# Patient Record
Sex: Male | Born: 1955
Health system: Southern US, Community
[De-identification: ages and names within clinical notes are randomized; demographics above are authoritative.]

## PROBLEM LIST (undated history)

## (undated) DIAGNOSIS — Z8249 Family history of ischemic heart disease and other diseases of the circulatory system: Secondary | ICD-10-CM

## (undated) DIAGNOSIS — M2550 Pain in unspecified joint: Secondary | ICD-10-CM

## (undated) DIAGNOSIS — E7800 Pure hypercholesterolemia, unspecified: Secondary | ICD-10-CM

## (undated) DIAGNOSIS — K219 Gastro-esophageal reflux disease without esophagitis: Secondary | ICD-10-CM

## (undated) DIAGNOSIS — IMO0001 Reserved for inherently not codable concepts without codable children: Secondary | ICD-10-CM

## (undated) DIAGNOSIS — M1990 Unspecified osteoarthritis, unspecified site: Secondary | ICD-10-CM

## (undated) DIAGNOSIS — S2239XA Fracture of one rib, unspecified side, initial encounter for closed fracture: Secondary | ICD-10-CM

## (undated) DIAGNOSIS — C61 Malignant neoplasm of prostate: Secondary | ICD-10-CM

## (undated) DIAGNOSIS — I10 Essential (primary) hypertension: Secondary | ICD-10-CM

## (undated) DIAGNOSIS — R262 Difficulty in walking, not elsewhere classified: Secondary | ICD-10-CM

## (undated) HISTORY — DX: Pain in unspecified joint: M25.50

## (undated) HISTORY — DX: Essential (primary) hypertension: I10

## (undated) HISTORY — DX: Family history of ischemic heart disease and other diseases of the circulatory system: Z82.49

## (undated) HISTORY — DX: Reserved for inherently not codable concepts without codable children: IMO0001

## (undated) HISTORY — DX: Difficulty in walking, not elsewhere classified: R26.2

## (undated) HISTORY — DX: Pure hypercholesterolemia, unspecified: E78.00

## (undated) HISTORY — PX: VASECTOMY: SHX75

---

## 1981-01-05 HISTORY — PX: OTHER SURGICAL HISTORY: SHX169

## 1982-01-05 DIAGNOSIS — S2249XA Multiple fractures of ribs, unspecified side, initial encounter for closed fracture: Secondary | ICD-10-CM

## 1982-01-05 HISTORY — DX: Multiple fractures of ribs, unspecified side, initial encounter for closed fracture: S22.49XA

## 2004-01-06 DIAGNOSIS — IMO0001 Reserved for inherently not codable concepts without codable children: Secondary | ICD-10-CM

## 2004-01-06 HISTORY — DX: Reserved for inherently not codable concepts without codable children: IMO0001

## 2010-04-10 ENCOUNTER — Encounter: Payer: Self-pay | Admitting: Cardiology

## 2010-04-10 DIAGNOSIS — M2550 Pain in unspecified joint: Secondary | ICD-10-CM | POA: Insufficient documentation

## 2010-04-10 DIAGNOSIS — R262 Difficulty in walking, not elsewhere classified: Secondary | ICD-10-CM | POA: Insufficient documentation

## 2010-04-10 DIAGNOSIS — H9190 Unspecified hearing loss, unspecified ear: Secondary | ICD-10-CM | POA: Insufficient documentation

## 2010-04-10 DIAGNOSIS — I10 Essential (primary) hypertension: Secondary | ICD-10-CM | POA: Insufficient documentation

## 2010-04-10 DIAGNOSIS — H9319 Tinnitus, unspecified ear: Secondary | ICD-10-CM | POA: Insufficient documentation

## 2010-04-10 DIAGNOSIS — E7800 Pure hypercholesterolemia, unspecified: Secondary | ICD-10-CM | POA: Insufficient documentation

## 2010-04-10 DIAGNOSIS — E785 Hyperlipidemia, unspecified: Secondary | ICD-10-CM | POA: Insufficient documentation

## 2010-04-14 ENCOUNTER — Ambulatory Visit (INDEPENDENT_AMBULATORY_CARE_PROVIDER_SITE_OTHER): Payer: 59 | Admitting: Cardiology

## 2010-04-14 ENCOUNTER — Encounter: Payer: Self-pay | Admitting: Cardiology

## 2010-04-14 DIAGNOSIS — E785 Hyperlipidemia, unspecified: Secondary | ICD-10-CM

## 2010-04-14 DIAGNOSIS — Z8249 Family history of ischemic heart disease and other diseases of the circulatory system: Secondary | ICD-10-CM

## 2010-04-14 DIAGNOSIS — I10 Essential (primary) hypertension: Secondary | ICD-10-CM

## 2010-04-14 NOTE — Assessment & Plan Note (Signed)
I'll have him check his blood pressure readings at home. They're slightly elevated today. He will remain on his lisinopril at the current does however. If his blood pressure is elevated, we will need to increase his lisinopril. 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

## 2010-04-14 NOTE — Assessment & Plan Note (Signed)
I will have him see Dr. Elease Hashimoto in one year with [facility].

## 2010-04-14 NOTE — Progress Notes (Signed)
Subjective:   Richard Wong comes in for followup visit. He is a IT sales professional. His wife is Richard Wong. He's not had any significant chest pain, lightheadedness, or dizziness. Activity is somewhat limited due to arthritis in his right knee. He does have a strong family history of heart disease. He remains. 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  Current Outpatient Prescriptions  Medication Sig Dispense Refill  . aspirin 81 MG tablet Take 81 mg by mouth daily.        Marland Kitchen atorvastatin (LIPITOR) 20 MG tablet Take 20 mg by mouth daily.        Marland Kitchen lisinopril (PRINIVIL,ZESTRIL) 10 MG tablet Take 10 mg by mouth daily.        Marland Kitchen omeprazole (PRILOSEC OTC) 20 MG tablet Take 1 tablet (20 mg total) by mouth daily. TAKE 1/2 TABLET DAIILY  30 tablet  0    No Known Allergies  Patient Active Problem List  Diagnoses  . Hypertension  . Hyperlipidemia  . Tinnitus  . Hearing loss  . Joint pain  . Difficulty walking  . Hypercholesterolemia    History  Smoking status  . Never Smoker   Smokeless tobacco  . Not on file    History  Alcohol Use No    Family History  Problem Relation Age of Onset  . Heart attack Father   . Heart disease      Review of Systems:   The patient denies any heat or cold intolerance.  No weight gain or weight loss.  The patient denies headaches or blurry vision.  There is no cough or sputum production.  The patient denies dizziness.  There is no hematuria or hematochezia.  The patient denies any muscle aches or arthritis.  The patient denies any rash.  The patient denies frequent falling or instability.  There is no history of depression or anxiety.  All other systems were reviewed and are negative.   Physical Exam:   Weight is 187. Blood pressure 142/88 sitting, heart rate is 71.The head is normocephalic and atraumatic.  Pupils are equally round and reactive to light.  Sclerae nonicteric.  Conjunctiva is clear.  Oropharynx is unremarkable.   There's adequate oral airway.  Neck is supple there are no masses.  Thyroid is not enlarged.  There is no lymphadenopathy.  Lungs are clear.  Chest is symmetric.  Heart shows a regular rate and rhythm.  S1 and S2 are normal.  There is no murmur click or gallop.  Abdomen is soft normal bowel sounds.  There is no organomegaly.  Genital and rectal deferred.  Extremities are without edema.  Peripheral pulses are adequate.  Neurologically intact.  Full range of motion.  The patient is not depressed.  Skin is warm and dry.   EKG is normal  Assessment / Plan:

## 2010-04-14 NOTE — Assessment & Plan Note (Signed)
I've asked him to get recent lab work from his previous physical exam. New prescriptions are written.

## 2010-05-20 ENCOUNTER — Other Ambulatory Visit: Payer: Self-pay | Admitting: Nurse Practitioner

## 2010-05-20 NOTE — Telephone Encounter (Signed)
escribe medication per fax request  

## 2011-04-22 ENCOUNTER — Other Ambulatory Visit: Payer: Self-pay | Admitting: Cardiology

## 2011-07-21 ENCOUNTER — Other Ambulatory Visit: Payer: Self-pay | Admitting: Nurse Practitioner

## 2011-08-19 ENCOUNTER — Encounter: Payer: Self-pay | Admitting: Nurse Practitioner

## 2011-12-28 ENCOUNTER — Other Ambulatory Visit: Payer: Self-pay | Admitting: Nurse Practitioner

## 2011-12-28 ENCOUNTER — Telehealth: Payer: Self-pay | Admitting: Nurse Practitioner

## 2011-12-28 MED ORDER — ATORVASTATIN CALCIUM 20 MG PO TABS: 20.0000 mg | ORAL_TABLET | Freq: Every day | ORAL | Status: DC

## 2011-12-28 NOTE — Telephone Encounter (Signed)
New problem:   lipitor 20 mg,. [facility] at [address].

## 2012-01-08 ENCOUNTER — Ambulatory Visit: Payer: 59 | Admitting: Nurse Practitioner

## 2012-01-26 ENCOUNTER — Encounter: Payer: Self-pay | Admitting: Nurse Practitioner

## 2012-01-26 ENCOUNTER — Ambulatory Visit (INDEPENDENT_AMBULATORY_CARE_PROVIDER_SITE_OTHER): Payer: 59 | Admitting: Nurse Practitioner

## 2012-01-26 VITALS — BP 136/78 | HR 87 | Ht 69.0 in | Wt 188.8 lb

## 2012-01-26 DIAGNOSIS — E785 Hyperlipidemia, unspecified: Secondary | ICD-10-CM

## 2012-01-26 DIAGNOSIS — I10 Essential (primary) hypertension: Secondary | ICD-10-CM

## 2012-01-26 MED ORDER — ATORVASTATIN CALCIUM 20 MG PO TABS: 20.0000 mg | ORAL_TABLET | ORAL | Status: DC

## 2012-01-26 MED ORDER — LISINOPRIL 10 MG PO TABS: 10.0000 mg | ORAL_TABLET | Freq: Every day | ORAL | Status: DC

## 2012-01-26 NOTE — Patient Instructions (Addendum)
Stay on your current medicines  Continue to send [place] your lab work  Stay active  See Dr. Elease Hashimoto in one year  Call the [facility] office at [phone] if you have any questions, problems or concerns.

## 2012-01-26 NOTE — Progress Notes (Signed)
Richard Wong Date of Birth: [birth year] Medical Record #[id number]  History of Present Illness: Richard Wong is seen back today for a follow up visit. It is an approximate 2 year check. He is a former patient of Dr. Ronnald Nian and will be establishing with Dr. Elease Hashimoto. He has HTN, HLD and a strong family history of CAD. 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 Last nuclear scan by [place] was back in 2006.   He comes in today. He is here alone. He is doing ok. No chest pain or shortness of breath. 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 He continues to work at the [employer]. His stress test now consists of running for 10 minutes with an EKG. He has not had any problems. Mostly limited by knee pain and generalized arthritis. Feels ok on his medicines. Needs refills.   Current Outpatient Prescriptions on File Prior to Visit  Medication Sig Dispense Refill  . aspirin 81 MG tablet Take 81 mg by mouth daily.        Marland Kitchen atorvastatin (LIPITOR) 20 MG tablet Take 1 tablet (20 mg total) by mouth as directed. 1/2 daily  90 tablet  3  . lisinopril (PRINIVIL,ZESTRIL) 10 MG tablet Take 1 tablet (10 mg total) by mouth daily.  90 tablet  3  . omeprazole (PRILOSEC OTC) 20 MG tablet Take 1 tablet (20 mg total) by mouth daily. TAKE 1/2 TABLET DAIILY  30 tablet  0    No Known Allergies  Past Medical History  Diagnosis Date  . Hypertension   . Hyperlipidemia   . Tinnitus   . Hearing loss   . Joint pain   . Difficulty walking   . Hypercholesterolemia   . Family history of cardiovascular disease   . Normal cardiac stress test 2006    Past Surgical History  Procedure Date  . Liver laceration 1983  . Vasectomy     History  Smoking status  . Never Smoker   Smokeless tobacco  . Not on file    History  Alcohol Use No    Family History  Problem Relation Age of Onset  . Heart attack Father   . Heart disease      Review of  Systems: The review of systems is per the HPI.  All other systems were reviewed and are negative.  Physical Exam: BP 136/78  Pulse 87  Ht 5\' 9"  (1.753 m)  Wt 188 lb 12.8 oz (85.639 kg)  BMI 27.88 kg/m2 Patient is very pleasant and in no acute distress. Skin is warm and dry. Color is normal.  HEENT is unremarkable. Normocephalic/atraumatic. PERRL. Sclera are nonicteric. Neck is supple. No masses. No JVD. Lungs are clear. Cardiac exam shows a regular rate and rhythm. Abdomen is soft. Extremities are without edema. Gait and ROM are intact. No gross neurologic deficits noted.  LABORATORY DATA: EKG today shows sinus rhythm and is normal.   His labs are noted as well today. Chemistries and CBC are normal. TC is 157, TG 98, HDL 45 and LDL 95.   No results found for this basename: WBC,  HGB,  HCT,  PLT,  GLUCOSE,  CHOL,  TRIG,  HDL,  LDLDIRECT,  LDLCALC,  ALT,  AST,  NA,  K,  CL,  CREATININE,  BUN,  CO2,  TSH,  PSA,  INR,  GLUF,  HGBA1C,  MICROALBUR   Assessment / Plan: 1. HTN -  blood pressure looks ok. No change in his regimen.   2. HLD - LDL is 95. No change in therapy. Exercise is encouraged.   3. Positive FH for CV disease - no symptoms at this time. Energy level is good. No chest pain.   Will see him back in one year. Medicines are refilled today. He will see Dr. Elease Hashimoto in 1 year.   Patient is agreeable to this plan and will call if any problems develop in the interim.

## 2012-02-16 ENCOUNTER — Telehealth: Payer: Self-pay | Admitting: Nurse Practitioner

## 2012-02-16 NOTE — Telephone Encounter (Signed)
New Problem:    Patient's wife called in because her husband's hand is hurting and she believes that it is connected to him taking atorvastatin (LIPITOR) 20 MG tablet.  Please call back.

## 2012-02-17 ENCOUNTER — Telehealth: Payer: Self-pay | Admitting: *Deleted

## 2012-02-17 NOTE — Telephone Encounter (Signed)
Typically, if there is a reaction from the statin, it would be diffuse muscle aching - like he has the flu - not localized to one area.

## 2012-02-17 NOTE — Telephone Encounter (Signed)
S/w pt I stated when you are on a statin achiness is not localized it is all over the body pt stated the pain was just in his one arm and he would continue to take the statin told pt to call if any more problems with arm.

## 2012-11-12 ENCOUNTER — Other Ambulatory Visit: Payer: Self-pay | Admitting: Nurse Practitioner

## 2013-01-16 ENCOUNTER — Other Ambulatory Visit: Payer: Self-pay | Admitting: Nurse Practitioner

## 2013-01-31 ENCOUNTER — Encounter: Payer: Self-pay | Admitting: Nurse Practitioner

## 2013-01-31 ENCOUNTER — Ambulatory Visit (INDEPENDENT_AMBULATORY_CARE_PROVIDER_SITE_OTHER): Payer: 59 | Admitting: Nurse Practitioner

## 2013-01-31 VITALS — BP 130/88 | HR 88 | Ht 69.0 in | Wt 193.4 lb

## 2013-01-31 DIAGNOSIS — E785 Hyperlipidemia, unspecified: Secondary | ICD-10-CM

## 2013-01-31 DIAGNOSIS — I10 Essential (primary) hypertension: Secondary | ICD-10-CM

## 2013-01-31 MED ORDER — ATORVASTATIN CALCIUM 20 MG PO TABS: 20.0000 mg | ORAL_TABLET | Freq: Every day | ORAL | Status: DC

## 2013-01-31 MED ORDER — LISINOPRIL 10 MG PO TABS: 10.0000 mg | ORAL_TABLET | Freq: Every day | ORAL | Status: DC

## 2013-01-31 NOTE — Progress Notes (Signed)
Richard Wong Date of Birth: 1955-06-12 Medical Record #[id number]  History of Present Illness: Richard Wong is seen back today for a one year check. Seen for Dr. Acie Fredrickson - he is a former patient of Dr. Susa Simmonds. He has HTN, HLD and strong family history of CAD. 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 Remote Myoview in 2006.  Last seen a year ago - was doing ok.   Comes back today. Here alone. Doing well. Needs his Lipitor refilled. Brings in his labs for review today - these look good. Tolerating his medicines. BP has been good. No chest pain. Usually has an annual EKG stress test with the fire department along with a breathing test - did not have this year - with his breathing test he passed out - was told he was too far bent over and that they see this occasionally. The department's doctor decided that he did not need his GXT this year. He has been doing fine with no symptoms. Limited by knee pain but not able to have TKR due to job restrictions (would end up on disability).    Current Outpatient Prescriptions  Medication Sig Dispense Refill  . aspirin 81 MG tablet Take 81 mg by mouth daily.        Marland Kitchen atorvastatin (LIPITOR) 20 MG tablet Take one-half tablet by  mouth daily  45 tablet  0  . Cholecalciferol (HM VITAMIN D3) 4000 UNITS CAPS Take by mouth.      Marland Kitchen lisinopril (PRINIVIL,ZESTRIL) 10 MG tablet Take 1 tablet (10 mg total) by mouth daily.  90 tablet  3  . Multiple Vitamins-Minerals (OSTEO COMPLEX PO) Take by mouth daily.      Marland Kitchen omeprazole (PRILOSEC OTC) 20 MG tablet Take 1 tablet (20 mg total) by mouth daily. TAKE 1/2 TABLET DAIILY  30 tablet  0   No current facility-administered medications for this visit.    No Known Allergies  Past Medical History  Diagnosis Date  . Hypertension   . Hyperlipidemia   . Tinnitus   . Hearing loss   . Joint pain   . Difficulty walking   . Hypercholesterolemia   . Family history of cardiovascular disease   . Normal cardiac stress  test 2006    Past Surgical History  Procedure Laterality Date  . Liver laceration  1983  . Vasectomy      History  Smoking status  . Never Smoker   Smokeless tobacco  . Not on file    History  Alcohol Use No    Family History  Problem Relation Age of Onset  . Heart attack Father   . Heart disease      Review of Systems: The review of systems is per the HPI.  All other systems were reviewed and are negative.  Physical Exam: Ht 5\' 9"  (1.753 m)  Wt 193 lb 6.4 oz (87.726 kg)  BMI 28.55 kg/m2 Patient is very pleasant and in no acute distress. Skin is warm and dry. Color is normal.  HEENT is unremarkable. Normocephalic/atraumatic. PERRL. Sclera are nonicteric. Neck is supple. No masses. No JVD. Lungs are clear. Cardiac exam shows a regular rate and rhythm. Abdomen is soft. Extremities are without edema. Gait and ROM are intact. No gross neurologic deficits noted.  Wt Readings from Last 3 Encounters:  01/31/13 193 lb 6.4 oz (87.726 kg)  01/26/12 188 lb 12.8 oz (85.639 kg)  04/14/10 187 lb (84.823 kg)     LABORATORY DATA: No results found for  this basename: WBC,  HGB,  HCT,  PLT,  GLUCOSE,  CHOL,  TRIG,  HDL,  LDLDIRECT,  LDLCALC,  ALT,  AST,  NA,  K,  CL,  CREATININE,  BUN,  CO2,  TSH,  PSA,  INR,  GLUF,  HGBA1C,  MICROALBUR     Assessment / Plan:  1. HTN - BP looks good.   2. HLD - labs from August are reviewed and are at goal - Lipitor refilled today.   3. Positive FH for CAD - no symptoms - continue with CV risk factor modification  See him back in a year. No change in therapy.   Patient is agreeable to this plan and will call if any problems develop in the interim.   Burtis Junes, RN, [place] [address] [place], [place]  [phone]

## 2013-01-31 NOTE — Patient Instructions (Addendum)
Stay on your current medicines  I have refilled your Lipitor today - for the 20 mg tablet - to take as directed.   Stay active  I will see you in a year.  Call the [place] office at [phone] if you have any questions, problems or concerns.

## 2013-08-29 ENCOUNTER — Ambulatory Visit (INDEPENDENT_AMBULATORY_CARE_PROVIDER_SITE_OTHER): Payer: 59 | Admitting: Urology

## 2013-08-29 DIAGNOSIS — R972 Elevated prostate specific antigen [PSA]: Secondary | ICD-10-CM

## 2013-08-29 DIAGNOSIS — N400 Enlarged prostate without lower urinary tract symptoms: Secondary | ICD-10-CM

## 2013-12-12 ENCOUNTER — Other Ambulatory Visit: Payer: Self-pay | Admitting: Urology

## 2013-12-12 ENCOUNTER — Ambulatory Visit (INDEPENDENT_AMBULATORY_CARE_PROVIDER_SITE_OTHER): Payer: 59 | Admitting: Urology

## 2013-12-12 DIAGNOSIS — R972 Elevated prostate specific antigen [PSA]: Secondary | ICD-10-CM

## 2013-12-12 DIAGNOSIS — N400 Enlarged prostate without lower urinary tract symptoms: Secondary | ICD-10-CM

## 2014-01-08 ENCOUNTER — Ambulatory Visit (HOSPITAL_COMMUNITY)
Admission: RE | Admit: 2014-01-08 | Discharge: 2014-01-08 | Disposition: A | Discharge status: Home / Self Care | Payer: 59 | Source: Ambulatory Visit | Attending: Urology | Admitting: Urology

## 2014-01-08 ENCOUNTER — Other Ambulatory Visit: Payer: Self-pay | Admitting: Urology

## 2014-01-08 DIAGNOSIS — K4020 Bilateral inguinal hernia, without obstruction or gangrene, not specified as recurrent: Secondary | ICD-10-CM | POA: Diagnosis not present

## 2014-01-08 DIAGNOSIS — R972 Elevated prostate specific antigen [PSA]: Secondary | ICD-10-CM

## 2014-01-08 DIAGNOSIS — Z01818 Encounter for other preprocedural examination: Secondary | ICD-10-CM | POA: Insufficient documentation

## 2014-01-08 DIAGNOSIS — N400 Enlarged prostate without lower urinary tract symptoms: Secondary | ICD-10-CM | POA: Insufficient documentation

## 2014-01-08 LAB — POCT I-STAT CREATININE: Creatinine, Ser: 1.3 mg/dL (ref 0.50–1.35)

## 2014-01-08 MED ORDER — GADOBENATE DIMEGLUMINE 529 MG/ML IV SOLN
20.0000 mL | Freq: Once | INTRAVENOUS | Status: AC | PRN
  Administered 2014-01-08: 18 mL via INTRAVENOUS

## 2014-01-16 ENCOUNTER — Other Ambulatory Visit (HOSPITAL_COMMUNITY): Payer: Self-pay | Admitting: *Deleted

## 2014-01-17 ENCOUNTER — Encounter (HOSPITAL_COMMUNITY)
Admission: RE | Admit: 2014-01-17 | Discharge: 2014-01-17 | Disposition: A | Discharge status: Home / Self Care | Payer: 59 | Source: Ambulatory Visit | Attending: Orthopedic Surgery | Admitting: Orthopedic Surgery

## 2014-01-17 ENCOUNTER — Encounter (HOSPITAL_COMMUNITY): Payer: Self-pay

## 2014-01-17 ENCOUNTER — Ambulatory Visit (HOSPITAL_COMMUNITY)
Admission: RE | Admit: 2014-01-17 | Discharge: 2014-01-17 | Disposition: A | Discharge status: Home / Self Care | Payer: 59 | Source: Ambulatory Visit | Attending: Anesthesiology | Admitting: Anesthesiology

## 2014-01-17 DIAGNOSIS — I10 Essential (primary) hypertension: Secondary | ICD-10-CM

## 2014-01-17 DIAGNOSIS — Z01818 Encounter for other preprocedural examination: Secondary | ICD-10-CM | POA: Diagnosis not present

## 2014-01-17 HISTORY — DX: Unspecified osteoarthritis, unspecified site: M19.90

## 2014-01-17 HISTORY — DX: Fracture of one rib, unspecified side, initial encounter for closed fracture: S22.39XA

## 2014-01-17 HISTORY — DX: Gastro-esophageal reflux disease without esophagitis: K21.9

## 2014-01-17 LAB — URINALYSIS, ROUTINE W REFLEX MICROSCOPIC
BILIRUBIN URINE: NEGATIVE
Glucose, UA: NEGATIVE mg/dL
Hgb urine dipstick: NEGATIVE
Ketones, ur: NEGATIVE mg/dL
LEUKOCYTES UA: NEGATIVE
Nitrite: NEGATIVE
PH: 5.5 (ref 5.0–8.0)
Protein, ur: NEGATIVE mg/dL
SPECIFIC GRAVITY, URINE: 1.009 (ref 1.005–1.030)
UROBILINOGEN UA: 0.2 mg/dL (ref 0.0–1.0)

## 2014-01-17 LAB — CBC
HCT: 48.6 % (ref 39.0–52.0)
HEMOGLOBIN: 16.3 g/dL (ref 13.0–17.0)
MCH: 30 pg (ref 26.0–34.0)
MCHC: 33.5 g/dL (ref 30.0–36.0)
MCV: 89.3 fL (ref 78.0–100.0)
Platelets: 212 10*3/uL (ref 150–400)
RBC: 5.44 MIL/uL (ref 4.22–5.81)
RDW: 12.6 % (ref 11.5–15.5)
WBC: 6.6 10*3/uL (ref 4.0–10.5)

## 2014-01-17 LAB — BASIC METABOLIC PANEL
Anion gap: 5 (ref 5–15)
BUN: 15 mg/dL (ref 6–23)
CALCIUM: 9.3 mg/dL (ref 8.4–10.5)
CO2: 29 mmol/L (ref 19–32)
Chloride: 106 mEq/L (ref 96–112)
Creatinine, Ser: 1.4 mg/dL — ABNORMAL HIGH (ref 0.50–1.35)
GFR calc Af Amer: 63 mL/min — ABNORMAL LOW (ref >90)
GFR, EST NON AFRICAN AMERICAN: 54 mL/min — AB (ref >90)
Glucose, Bld: 97 mg/dL (ref 70–99)
Potassium: 4.3 mmol/L (ref 3.5–5.1)
Sodium: 140 mmol/L (ref 135–145)

## 2014-01-17 LAB — PROTIME-INR
INR: 0.98 (ref 0.00–1.49)
PROTHROMBIN TIME: 13.1 s (ref 11.6–15.2)

## 2014-01-17 LAB — SURGICAL PCR SCREEN
MRSA, PCR: NEGATIVE
Staphylococcus aureus: POSITIVE — AB

## 2014-01-17 LAB — APTT: aPTT: 33 seconds (ref 24–37)

## 2014-01-17 NOTE — Patient Instructions (Signed)
Richard Wong  01/17/2014   Your procedure is scheduled on: Monday 01/22/2014  Report to [facility] Main  Entrance and follow signs to               [place] at 0810 AM.  Call this number if you have problems the morning of surgery [phone]   Remember:  Do not eat food or drink liquids :After Midnight.     Take these medicines the morning of surgery with A SIP OF WATER: Prilosec                               You may not have any metal on your body including hair pins and              piercings  Do not wear jewelry, make-up, lotions, powders or perfumes.             Do not wear nail polish.  Do not shave  48 hours prior to surgery.              Men may shave face and neck.   Do not bring valuables to the hospital. [name].  Contacts, dentures or bridgework may not be worn into surgery.  Leave suitcase in the car. After surgery it may be brought to your room.     Patients discharged the day of surgery will not be allowed to drive home.  Name and phone number of your driver:  Special Instructions: N/A              Please read over the following fact sheets you were given: _____________________________________________________________________             [facility] - Preparing for Surgery Before surgery, you can play an important role.  Because skin is not sterile, your skin needs to be as free of germs as possible.  You can reduce the number of germs on your skin by washing with CHG (chlorahexidine gluconate) soap before surgery.  CHG is an antiseptic cleaner which kills germs and bonds with the skin to continue killing germs even after washing. Please DO NOT use if you have an allergy to CHG or antibacterial soaps.  If your skin becomes reddened/irritated stop using the CHG and inform your nurse when you arrive at Short Stay. Do not shave (including legs and underarms) for at least 48 hours  prior to the first CHG shower.  You may shave your face/neck. Please follow these instructions carefully:  1.  Shower with CHG Soap the night before surgery and the  morning of Surgery.  2.  If you choose to wash your hair, wash your hair first as usual with your  normal  shampoo.  3.  After you shampoo, rinse your hair and body thoroughly to remove the  shampoo.                           4.  Use CHG as you would any other liquid soap.  You can apply chg directly  to the skin and wash                       Gently  with a scrungie or clean washcloth.  5.  Apply the CHG Soap to your body ONLY FROM THE NECK DOWN.   Do not use on face/ open                           Wound or open sores. Avoid contact with eyes, ears mouth and genitals (private parts).                       Wash face,  Genitals (private parts) with your normal soap.             6.  Wash thoroughly, paying special attention to the area where your surgery  will be performed.  7.  Thoroughly rinse your body with warm water from the neck down.  8.  DO NOT shower/wash with your normal soap after using and rinsing off  the CHG Soap.                9.  Pat yourself dry with a clean towel.            10.  Wear clean pajamas.            11.  Place clean sheets on your bed the night of your first shower and do not  sleep with pets. Day of Surgery : Do not apply any lotions/deodorants the morning of surgery.  Please wear clean clothes to the hospital/surgery center.  FAILURE TO FOLLOW THESE INSTRUCTIONS MAY RESULT IN THE CANCELLATION OF YOUR SURGERY PATIENT SIGNATURE_________________________________  NURSE SIGNATURE__________________________________  ________________________________________________________________________   Richard Wong  An incentive spirometer is a tool that can help keep your lungs clear and active. This tool measures how well you are filling your lungs with each breath. Taking long deep breaths may help reverse  or decrease the chance of developing breathing (pulmonary) problems (especially infection) following:  A long period of time when you are unable to move or be active. BEFORE THE PROCEDURE   If the spirometer includes an indicator to show your best effort, your nurse or respiratory therapist will set it to a desired goal.  If possible, sit up straight or lean slightly forward. Try not to slouch.  Hold the incentive spirometer in an upright position. INSTRUCTIONS FOR USE   Sit on the edge of your bed if possible, or sit up as far as you can in bed or on a chair.  Hold the incentive spirometer in an upright position.  Breathe out normally.  Place the mouthpiece in your mouth and seal your lips tightly around it.  Breathe in slowly and as deeply as possible, raising the piston or the ball toward the top of the column.  Hold your breath for 3-5 seconds or for as long as possible. Allow the piston or ball to fall to the bottom of the column.  Remove the mouthpiece from your mouth and breathe out normally.  Rest for a few seconds and repeat Steps 1 through 7 at least 10 times every 1-2 hours when you are awake. Take your time and take a few normal breaths between deep breaths.  The spirometer may include an indicator to show your best effort. Use the indicator as a goal to work toward during each repetition.  After each set of 10 deep breaths, practice coughing to be sure your lungs are clear. If you have an incision (the cut made at the time of surgery), support  your incision when coughing by placing a pillow or rolled up towels firmly against it. Once you are able to get out of bed, walk around indoors and cough well. You may stop using the incentive spirometer when instructed by your caregiver.  RISKS AND COMPLICATIONS  Take your time so you do not get dizzy or light-headed.  If you are in pain, you may need to take or ask for pain medication before doing incentive spirometry. It is  harder to take a deep breath if you are having pain. AFTER USE  Rest and breathe slowly and easily.  It can be helpful to keep track of a log of your progress. Your caregiver can provide you with a simple table to help with this. If you are using the spirometer at home, follow these instructions: [place] IF:   You are having difficultly using the spirometer.  You have trouble using the spirometer as often as instructed.  Your pain medication is not giving enough relief while using the spirometer.  You develop fever of 100.5 F (38.1 C) or higher. SEEK IMMEDIATE MEDICAL CARE IF:   You cough up bloody sputum that had not been present before.  You develop fever of 102 F (38.9 C) or greater.  You develop worsening pain at or near the incision site. MAKE SURE YOU:   Understand these instructions.  Will watch your condition.  Will get help right away if you are not doing well or get worse. Document Released: 05/04/2006 Document Revised: 03/16/2011 Document Reviewed: 07/05/2006 [employer] Patient Information 2014 [employer], [place].   ________________________________________________________________________  WHAT IS A BLOOD TRANSFUSION? Blood Transfusion Information  A transfusion is the replacement of blood or some of its parts. Blood is made up of multiple cells which provide different functions.  Red blood cells carry oxygen and are used for blood loss replacement.  White blood cells fight against infection.  Platelets control bleeding.  Plasma helps clot blood.  Other blood products are available for specialized needs, such as hemophilia or other clotting disorders. BEFORE THE TRANSFUSION  Who gives blood for transfusions?   Healthy volunteers who are fully evaluated to make sure their blood is safe. This is blood bank blood. Transfusion therapy is the safest it has ever been in the practice of medicine. Before blood is taken from a donor, a complete history  is taken to make sure that person has no history of diseases nor engages in risky social behavior (examples are intravenous drug use or sexual activity with multiple partners). The donor's travel history is screened to minimize risk of transmitting infections, such as malaria. The donated blood is tested for signs of infectious diseases, such as HIV and hepatitis. The blood is then tested to be sure it is compatible with you in order to minimize the chance of a transfusion reaction. If you or a relative donates blood, this is often done in anticipation of surgery and is not appropriate for emergency situations. It takes many days to process the donated blood. RISKS AND COMPLICATIONS Although transfusion therapy is very safe and saves many lives, the main dangers of transfusion include:   Getting an infectious disease.  Developing a transfusion reaction. This is an allergic reaction to something in the blood you were given. Every precaution is taken to prevent this. The decision to have a blood transfusion has been considered carefully by your caregiver before blood is given. Blood is not given unless the benefits outweigh the risks. AFTER THE TRANSFUSION  Right after receiving a blood transfusion, you will usually feel much better and more energetic. This is especially true if your red blood cells have gotten low (anemic). The transfusion raises the level of the red blood cells which carry oxygen, and this usually causes an energy increase.  The nurse administering the transfusion will monitor you carefully for complications. HOME CARE INSTRUCTIONS  No special instructions are needed after a transfusion. You may find your energy is better. Speak with your caregiver about any limitations on activity for underlying diseases you may have. SEEK MEDICAL CARE IF:   Your condition is not improving after your transfusion.  You develop redness or irritation at the intravenous (IV) site. SEEK IMMEDIATE  MEDICAL CARE IF:  Any of the following symptoms occur over the next 12 hours:  Shaking chills.  You have a temperature by mouth above 102 F (38.9 C), not controlled by medicine.  Chest, back, or muscle pain.  People around you feel you are not acting correctly or are confused.  Shortness of breath or difficulty breathing.  Dizziness and fainting.  You get a rash or develop hives.  You have a decrease in urine output.  Your urine turns a dark color or changes to pink, red, or brown. Any of the following symptoms occur over the next 10 days:  You have a temperature by mouth above 102 F (38.9 C), not controlled by medicine.  Shortness of breath.  Weakness after normal activity.  The white part of the eye turns yellow (jaundice).  You have a decrease in the amount of urine or are urinating less often.  Your urine turns a dark color or changes to pink, red, or brown. Document Released: 12/20/1999 Document Revised: 03/16/2011 Document Reviewed: 08/08/2007 [facility] Patient Information 2014 [employer], [place].  _______________________________________________________________________

## 2014-01-17 NOTE — Progress Notes (Signed)
   01/17/14 0928  OBSTRUCTIVE SLEEP APNEA  Have you ever been diagnosed with sleep apnea through a sleep study? No  Do you snore loudly (loud enough to be heard through closed doors)?  0  Do you often feel tired, fatigued, or sleepy during the daytime? 0  Has anyone observed you stop breathing during your sleep? 0  Do you have, or are you being treated for high blood pressure? 1  BMI more than 35 kg/m2? 0  Age over 59 years old? 1  Neck circumference greater than 40 cm/16 inches? 1  Gender: 1  Obstructive Sleep Apnea Score 4  Score 4 or greater  Results sent to PCP
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
## 2014-01-22 ENCOUNTER — Encounter (HOSPITAL_COMMUNITY)
Admission: RE | Disposition: A | Discharge status: Home / Self Care | Payer: Self-pay | Source: Ambulatory Visit | Attending: Orthopedic Surgery

## 2014-01-22 ENCOUNTER — Ambulatory Visit (HOSPITAL_COMMUNITY): Payer: 59 | Admitting: Certified Registered Nurse Anesthetist

## 2014-01-22 ENCOUNTER — Ambulatory Visit (HOSPITAL_COMMUNITY)
Admission: RE | Admit: 2014-01-22 | Discharge: 2014-01-22 | Disposition: A | Discharge status: Home / Self Care | Payer: 59 | Source: Ambulatory Visit | Attending: Orthopedic Surgery | Admitting: Orthopedic Surgery

## 2014-01-22 ENCOUNTER — Encounter (HOSPITAL_COMMUNITY): Payer: Self-pay | Admitting: *Deleted

## 2014-01-22 DIAGNOSIS — I10 Essential (primary) hypertension: Secondary | ICD-10-CM | POA: Diagnosis not present

## 2014-01-22 DIAGNOSIS — K219 Gastro-esophageal reflux disease without esophagitis: Secondary | ICD-10-CM | POA: Diagnosis not present

## 2014-01-22 DIAGNOSIS — H9190 Unspecified hearing loss, unspecified ear: Secondary | ICD-10-CM | POA: Insufficient documentation

## 2014-01-22 DIAGNOSIS — M1990 Unspecified osteoarthritis, unspecified site: Secondary | ICD-10-CM | POA: Diagnosis not present

## 2014-01-22 DIAGNOSIS — M179 Osteoarthritis of knee, unspecified: Secondary | ICD-10-CM | POA: Diagnosis present

## 2014-01-22 DIAGNOSIS — Z96651 Presence of right artificial knee joint: Secondary | ICD-10-CM

## 2014-01-22 DIAGNOSIS — E780 Pure hypercholesterolemia: Secondary | ICD-10-CM | POA: Insufficient documentation

## 2014-01-22 DIAGNOSIS — M1711 Unilateral primary osteoarthritis, right knee: Secondary | ICD-10-CM | POA: Insufficient documentation

## 2014-01-22 DIAGNOSIS — E785 Hyperlipidemia, unspecified: Secondary | ICD-10-CM | POA: Diagnosis not present

## 2014-01-22 HISTORY — PX: PARTIAL KNEE ARTHROPLASTY: SHX2174

## 2014-01-22 LAB — TYPE AND SCREEN
ABO/RH(D): A NEG
Antibody Screen: NEGATIVE

## 2014-01-22 LAB — ABO/RH: ABO/RH(D): A NEG

## 2014-01-22 SURGERY — ARTHROPLASTY, KNEE, UNICOMPARTMENTAL
Anesthesia: Spinal | Site: Knee | Laterality: Right

## 2014-01-22 MED ORDER — CELECOXIB 200 MG PO CAPS: 200.0000 mg | ORAL_CAPSULE | Freq: Two times a day (BID) | ORAL | Status: DC

## 2014-01-22 MED ORDER — ONDANSETRON HCL 4 MG/2ML IJ SOLN
INTRAMUSCULAR | Status: DC | PRN
  Administered 2014-01-22: 4 mg via INTRAVENOUS

## 2014-01-22 MED ORDER — KETOROLAC TROMETHAMINE 30 MG/ML IJ SOLN
INTRAMUSCULAR | Status: DC | PRN
  Administered 2014-01-22: 30 mg via INTRAVENOUS

## 2014-01-22 MED ORDER — PROPOFOL INFUSION 10 MG/ML OPTIME
INTRAVENOUS | Status: DC | PRN
  Administered 2014-01-22: 120 ug/kg/min via INTRAVENOUS

## 2014-01-22 MED ORDER — PROPOFOL 10 MG/ML IV BOLUS
INTRAVENOUS | Status: AC
  Filled 2014-01-22: qty 20

## 2014-01-22 MED ORDER — CEFAZOLIN SODIUM-DEXTROSE 2-3 GM-% IV SOLR
2.0000 g | INTRAVENOUS | Status: AC
  Administered 2014-01-22: 2 g via INTRAVENOUS

## 2014-01-22 MED ORDER — 0.9 % SODIUM CHLORIDE (POUR BTL) OPTIME
TOPICAL | Status: DC | PRN
  Administered 2014-01-22: 1000 mL

## 2014-01-22 MED ORDER — BUPIVACAINE IN DEXTROSE 0.75-8.25 % IT SOLN
INTRATHECAL | Status: DC | PRN
  Administered 2014-01-22: 2 mL via INTRATHECAL

## 2014-01-22 MED ORDER — KETOROLAC TROMETHAMINE 30 MG/ML IJ SOLN
INTRAMUSCULAR | Status: AC
  Filled 2014-01-22: qty 1

## 2014-01-22 MED ORDER — CEFAZOLIN SODIUM-DEXTROSE 2-3 GM-% IV SOLR
INTRAVENOUS | Status: AC
  Filled 2014-01-22: qty 50

## 2014-01-22 MED ORDER — OXYCODONE HCL 5 MG/5ML PO SOLN: 5.0000 mg | Freq: Once | ORAL | Status: DC | PRN

## 2014-01-22 MED ORDER — LACTATED RINGERS IV SOLN
INTRAVENOUS | Status: DC | PRN
  Administered 2014-01-22 (×2): via INTRAVENOUS

## 2014-01-22 MED ORDER — CHLORHEXIDINE GLUCONATE 4 % EX LIQD: 60.0000 mL | Freq: Once | CUTANEOUS | Status: DC

## 2014-01-22 MED ORDER — PROMETHAZINE HCL 25 MG/ML IJ SOLN: 6.2500 mg | INTRAMUSCULAR | Status: DC | PRN

## 2014-01-22 MED ORDER — ONDANSETRON HCL 4 MG/2ML IJ SOLN
INTRAMUSCULAR | Status: AC
  Filled 2014-01-22: qty 2

## 2014-01-22 MED ORDER — LIDOCAINE HCL (CARDIAC) 20 MG/ML IV SOLN
INTRAVENOUS | Status: AC
  Filled 2014-01-22: qty 5

## 2014-01-22 MED ORDER — ASPIRIN EC 325 MG PO TBEC: 325.0000 mg | DELAYED_RELEASE_TABLET | Freq: Two times a day (BID) | ORAL | Status: DC

## 2014-01-22 MED ORDER — SODIUM CHLORIDE 0.9 % IJ SOLN
INTRAMUSCULAR | Status: DC | PRN
  Administered 2014-01-22: 30 mL via INTRAVENOUS

## 2014-01-22 MED ORDER — HYDROCODONE-ACETAMINOPHEN 7.5-325 MG PO TABS: 1.0000 | ORAL_TABLET | ORAL | Status: DC | PRN

## 2014-01-22 MED ORDER — METHOCARBAMOL 500 MG PO TABS: 500.0000 mg | ORAL_TABLET | Freq: Four times a day (QID) | ORAL | Status: DC | PRN

## 2014-01-22 MED ORDER — DEXAMETHASONE SODIUM PHOSPHATE 10 MG/ML IJ SOLN
10.0000 mg | Freq: Once | INTRAMUSCULAR | Status: AC
  Administered 2014-01-22: 10 mg via INTRAVENOUS

## 2014-01-22 MED ORDER — SODIUM CHLORIDE 0.9 % IJ SOLN
INTRAMUSCULAR | Status: AC
  Filled 2014-01-22: qty 50

## 2014-01-22 MED ORDER — METHOCARBAMOL 500 MG PO TABS: 500.0000 mg | ORAL_TABLET | Freq: Four times a day (QID) | ORAL | Status: DC

## 2014-01-22 MED ORDER — BUPIVACAINE-EPINEPHRINE 0.25% -1:200000 IJ SOLN
INTRAMUSCULAR | Status: DC | PRN
  Administered 2014-01-22: 30 mL

## 2014-01-22 MED ORDER — HYDROCODONE-ACETAMINOPHEN 7.5-325 MG PO TABS
1.0000 | ORAL_TABLET | ORAL | Status: DC | PRN
  Administered 2014-01-22: 1 via ORAL
  Filled 2014-01-22: qty 1

## 2014-01-22 MED ORDER — LIDOCAINE HCL (CARDIAC) 20 MG/ML IV SOLN
INTRAVENOUS | Status: DC | PRN
  Administered 2014-01-22: 100 mg via INTRAVENOUS

## 2014-01-22 MED ORDER — MIDAZOLAM HCL 5 MG/5ML IJ SOLN
INTRAMUSCULAR | Status: DC | PRN
  Administered 2014-01-22: 2 mg via INTRAVENOUS

## 2014-01-22 MED ORDER — FENTANYL CITRATE 0.05 MG/ML IJ SOLN
INTRAMUSCULAR | Status: AC
  Filled 2014-01-22: qty 2

## 2014-01-22 MED ORDER — DEXAMETHASONE SODIUM PHOSPHATE 10 MG/ML IJ SOLN
INTRAMUSCULAR | Status: AC
  Filled 2014-01-22: qty 1

## 2014-01-22 MED ORDER — FENTANYL CITRATE 0.05 MG/ML IJ SOLN
INTRAMUSCULAR | Status: DC | PRN
  Administered 2014-01-22 (×2): 50 ug via INTRAVENOUS

## 2014-01-22 MED ORDER — METHOCARBAMOL 1000 MG/10ML IJ SOLN
500.0000 mg | Freq: Four times a day (QID) | INTRAVENOUS | Status: DC | PRN
  Administered 2014-01-22: 500 mg via INTRAVENOUS
  Filled 2014-01-22 (×2): qty 5

## 2014-01-22 MED ORDER — LACTATED RINGERS IV SOLN
INTRAVENOUS | Status: DC
  Administered 2014-01-22: 13:00:00 via INTRAVENOUS

## 2014-01-22 MED ORDER — MIDAZOLAM HCL 2 MG/2ML IJ SOLN
INTRAMUSCULAR | Status: AC
  Filled 2014-01-22: qty 2

## 2014-01-22 MED ORDER — CEPHALEXIN 500 MG PO CAPS: 500.0000 mg | ORAL_CAPSULE | Freq: Three times a day (TID) | ORAL | Status: DC

## 2014-01-22 MED ORDER — OXYCODONE HCL 5 MG PO TABS: 5.0000 mg | ORAL_TABLET | Freq: Once | ORAL | Status: DC | PRN

## 2014-01-22 MED ORDER — TRANEXAMIC ACID 100 MG/ML IV SOLN
1000.0000 mg | Freq: Once | INTRAVENOUS | Status: AC
  Administered 2014-01-22: 1000 mg via INTRAVENOUS
  Filled 2014-01-22: qty 10

## 2014-01-22 MED ORDER — BUPIVACAINE-EPINEPHRINE (PF) 0.25% -1:200000 IJ SOLN
INTRAMUSCULAR | Status: AC
  Filled 2014-01-22: qty 30

## 2014-01-22 MED ORDER — HYDROMORPHONE HCL 1 MG/ML IJ SOLN: 0.2500 mg | INTRAMUSCULAR | Status: DC | PRN

## 2014-01-22 SURGICAL SUPPLY — 50 items
BAG ZIPLOCK 12X15 (MISCELLANEOUS) IMPLANT
BANDAGE ELASTIC 6 VELCRO ST LF (GAUZE/BANDAGES/DRESSINGS) ×2 IMPLANT
BANDAGE ESMARK 6X9 LF (GAUZE/BANDAGES/DRESSINGS) ×1 IMPLANT
BLADE SAW RECIPROCATING 77.5 (BLADE) ×2 IMPLANT
BLADE SAW SGTL 13.0X1.19X90.0M (BLADE) ×2 IMPLANT
BNDG ESMARK 6X9 LF (GAUZE/BANDAGES/DRESSINGS) ×2
BOWL SMART MIX CTS (DISPOSABLE) ×2 IMPLANT
CAPT KNEE PARTIAL 2 ×2 IMPLANT
CEMENT HV SMART SET (Cement) ×2 IMPLANT
CUFF TOURN SGL QUICK 34 (TOURNIQUET CUFF) ×1
CUFF TRNQT CYL 34X4X40X1 (TOURNIQUET CUFF) ×1 IMPLANT
DERMABOND ADVANCED (GAUZE/BANDAGES/DRESSINGS) ×1
DERMABOND ADVANCED .7 DNX12 (GAUZE/BANDAGES/DRESSINGS) ×1 IMPLANT
DRAPE EXTREMITY T 121X128X90 (DRAPE) ×2 IMPLANT
DRAPE POUCH INSTRU U-SHP 10X18 (DRAPES) ×2 IMPLANT
DRAPE U-SHAPE 47X51 STRL (DRAPES) ×2 IMPLANT
DRSG AQUACEL AG ADV 3.5X10 (GAUZE/BANDAGES/DRESSINGS) ×2 IMPLANT
DRSG TEGADERM 4X4.75 (GAUZE/BANDAGES/DRESSINGS) IMPLANT
DURAPREP 26ML APPLICATOR (WOUND CARE) ×4 IMPLANT
ELECT REM PT RETURN 9FT ADLT (ELECTROSURGICAL) ×2
ELECTRODE REM PT RTRN 9FT ADLT (ELECTROSURGICAL) ×1 IMPLANT
EVACUATOR 1/8 PVC DRAIN (DRAIN) IMPLANT
FACESHIELD WRAPAROUND (MASK) ×6 IMPLANT
GAUZE SPONGE 2X2 8PLY STRL LF (GAUZE/BANDAGES/DRESSINGS) IMPLANT
GLOVE BIOGEL PI IND STRL 7.5 (GLOVE) ×1 IMPLANT
GLOVE BIOGEL PI IND STRL 8.5 (GLOVE) ×1 IMPLANT
GLOVE BIOGEL PI INDICATOR 7.5 (GLOVE) ×1
GLOVE BIOGEL PI INDICATOR 8.5 (GLOVE) ×1
GLOVE ECLIPSE 8.0 STRL XLNG CF (GLOVE) ×2 IMPLANT
GLOVE ORTHO TXT STRL SZ7.5 (GLOVE) ×4 IMPLANT
GOWN SPEC L3 XXLG W/TWL (GOWN DISPOSABLE) ×2 IMPLANT
GOWN STRL REUS W/TWL LRG LVL3 (GOWN DISPOSABLE) ×2 IMPLANT
KIT BASIN OR (CUSTOM PROCEDURE TRAY) ×2 IMPLANT
LEGGING LITHOTOMY PAIR STRL (DRAPES) ×2 IMPLANT
MANIFOLD NEPTUNE II (INSTRUMENTS) ×2 IMPLANT
NDL SAFETY ECLIPSE 18X1.5 (NEEDLE) ×1 IMPLANT
NEEDLE HYPO 18GX1.5 SHARP (NEEDLE) ×1
PACK TOTAL JOINT (CUSTOM PROCEDURE TRAY) ×2 IMPLANT
SPONGE GAUZE 2X2 STER 10/PKG (GAUZE/BANDAGES/DRESSINGS)
SUCTION FRAZIER TIP 10 FR DISP (SUCTIONS) ×2 IMPLANT
SUT MNCRL AB 4-0 PS2 18 (SUTURE) ×2 IMPLANT
SUT VIC AB 1 CT1 36 (SUTURE) ×2 IMPLANT
SUT VIC AB 2-0 CT1 27 (SUTURE) ×2
SUT VIC AB 2-0 CT1 TAPERPNT 27 (SUTURE) ×2 IMPLANT
SUT VLOC 180 0 24IN GS25 (SUTURE) ×2 IMPLANT
SYR 50ML LL SCALE MARK (SYRINGE) ×2 IMPLANT
TOWEL OR 17X26 10 PK STRL BLUE (TOWEL DISPOSABLE) ×2 IMPLANT
TOWEL OR NON WOVEN STRL DISP B (DISPOSABLE) IMPLANT
TRAY FOLEY CATH 16FRSI W/METER (SET/KITS/TRAYS/PACK) ×2 IMPLANT
WRAP KNEE MAXI GEL POST OP (GAUZE/BANDAGES/DRESSINGS) ×2 IMPLANT

## 2014-01-22 NOTE — Anesthesia Postprocedure Evaluation (Signed)
Anesthesia Post Note  Patient: Richard Wong  Procedure(s) Performed: Procedure(s) (LRB): RIGHT UNICOMPARTMENTAL KNEE ARTHROPLASTY MEDIALLY  (Right)  Anesthesia type: MAC/SAB  Patient location: PACU  Post pain: Pain level controlled  Post assessment: Patient's Cardiovascular Status Stable  Last Vitals:  Filed Vitals:   01/22/14 0904  BP: 115/61  Pulse: 70  Temp: 36.3 C  Resp: 10    Post vital signs: Reviewed and stable  Level of consciousness: sedated  Complications: No apparent anesthesia complications

## 2014-01-22 NOTE — Op Note (Signed)
NAME: Richard Wong    MEDICAL RECORD NO.: [id number]   FACILITY: [facility] OF BIRTH: Jan 15, 1955  PHYSICIAN: Pietro Cassis. Alvan Dame, M.D.    DATE OF PROCEDURE: 01/22/2014    OPERATIVE REPORT   PREOPERATIVE DIAGNOSIS: Right knee medial compartment osteoarthritis.   POSTOPERATIVE DIAGNOSIS: Right knee medial compartment osteoarthritis.  PROCEDURE: Right partial knee replacement utilizing Biomet Oxford knee  component, size SMALL femur, a right medial size B tibial tray with a size 4 mm insert.   SURGEON: Pietro Cassis. Alvan Dame, M.D.   ASSISTANT: Danae Orleans, PAC.  Please note that Mr. Richard Wong was present for the entirety of the case,  utilized for preoperative positioning, perioperative retractor  management, general facilitation of the case and primary wound closure.   ANESTHESIA: Spinal.   SPECIMENS: None.   COMPLICATIONS: None.  DRAINS: None   TOURNIQUET TIME: 29 minutes at 250 mmHg.   INDICATIONS FOR PROCEDURE: The patient is a 59 y.o. patient of mine who presented for evaluation of right knee pain.  They presented with primary complaints of pain on the medial side of their knee. Radiographs revealed advanced medial compartment arthritis with specifically an antero-medial wear pattern.  There was bone on bone changes noted with subchondral sclerosis and osteophytes present. The patient has had progressive problems failing to respond to conservative measures of medications, injections and activity modification. Risks of infection, DVT, component failure, need for future revision surgery were all discussed and reviewed.  Consent was obtained for benefit of pain relief.   PROCEDURE IN DETAIL: The patient was brought to the operative theater.  Once adequate anesthesia, preoperative antibiotics, 2gm of Ancef, 1gm of Tranexamic Acid and 10mg  of Decadron administered, the patient was positioned in supine position with a right thigh tourniquet  placed. The rigt lower extremity was prepped  and draped in sterile  fashion with the leg on the Oxford leg holder.  The leg was allowed to flex to 120 degrees. A time-out  was performed identifying the patient, planned procedure, and extremity.  The leg was exsanguinated, tourniquet elevated to 250 mmHg. A midline  incision was made from the proximal pole of the patella to the tibial tubercle. A  soft tissue plane was created and partial median arthrotomy was then  made to allow for subluxation of the patella. Following initial synovectomy and  debridement, the osteophytes were removed off the medial aspect of the  knee.   Attention was first directed to the tibia. I sized the femur to be a SMALL with the spoons.  With the small spoon in place the tibial  extramedullary guide was positioned over the anterior crest of the tibia  and pinned into position, and using a measured resection guide from the  [employer] system, a 4 mm resection was made off the proximal tibia. First  the reciprocating saw along the medial aspect of the tibial spines, then the oscillating saw.    At this point, I sized this cut surface seem to be best fit for a size B tibial tray.  With the retractors out of the wound and the knee held at 90 degrees the 4 feeler gauge had appropriate tension on the medial ligament.   At this point, the femoral canal was opened with a drill and the  intramedullary rod passed. Then using the guide for a small posterior resection off  the posterior aspect of the femur was positioned over the mid portion of the medial femoral condyle.  The orientation was set using  the guide that mates the femoral guide to the intramedullary rod.  The 2 drill holes were made into the distal femur.  The posterior guide was then impacted into place and the posterior  femoral cut made.  At this point, I milled the distal femur with a size 4 spigot in place. At this point, we did a trial reduction of the small femur, size B tibial tray and a size 4 feeler  gauge. At 90 degrees of  flexion and at 20 degrees of flexion the knee had symmetric tension on  the ligaments.   Given these findings, the trial femoral component was removed. Final preparation of tibia was carried out by pinning it in position. Then  using a reciprocating saw I removed bone for the keel. Further bone was  removed with an osteotome.  Trial reduction was now carried out with the small femur, the right medial keeled B tibia, and a size 4 lollipop insert. The balance of the  ligaments appeared to be symmetric at 20 degrees and 90 degrees. Given  all these findings, the trial components were removed.   Cement was mixed. The final components were opened. The knee was irrigated with  normal saline solution. The synovial capsular junction was injected with 30cc of 0.25% Marcaine, 1cc of Toradol, and 20cc of NS.  Then final debridements of the  soft tissue was carried out, I also drilled the sclerotic bone with a drill.  The final components were cemented with a single batch of cement in a  two-stage technique with the tibial component cemented first. The knee  was then brought  to 45 degrees of flexion with a 4 feeler gauge, held with pressure for a minute and half.  After this the femoral component was cemented in place.  The knee was again held at 45 degrees of flexion while the cement fully cured.  Excess cement was removed throughout the knee. Tourniquet was let down  after 29 minutes. After the cement had fully cured and excessive cement  was removed throughout the knee there was no visualized cement present.   The final right medial small size 4 was chosen and snapped into position. We re-irrigated  the knee. The extensor mechanism  was then reapproximated using a #1 Vicryl and #0 V-lock sutures with the knee in flexion. The  remaining wound was closed with 2-0 Vicryl and a running 4-0 Monocryl.  The knee was cleaned, dried, and dressed sterilely using Dermabond and   Aquacel dressing. The patient  was brought to the recovery room, Ace wrap in place, tolerating the  procedure well. He is planning on being discharged to home today after recovery.  We will initiate physical therapy and progress to ambulate.     Pietro Cassis Alvan Dame, M.D.

## 2014-01-22 NOTE — Progress Notes (Signed)
Spoke with Dr Alvan Dame regarding [facility] and plan is for office to set up tomorrow and will be in touch with patient. Report given to Ivor Costa.

## 2014-01-22 NOTE — Transfer of Care (Signed)
Immediate Anesthesia Transfer of Care Note  Patient: Richard Wong  Procedure(s) Performed: Procedure(s) (LRB): RIGHT UNICOMPARTMENTAL KNEE ARTHROPLASTY MEDIALLY  (Right)  Patient Location: PACU  Anesthesia Type: Spinal  Level of Consciousness: sedated, patient cooperative and responds to stimulation  Airway & Oxygen Therapy: Patient Spontanous Breathing and Patient connected to face mask oxgen  Post-op Assessment: Report given to PACU RN and Post -op Vital signs reviewed and stable  Post vital signs: Reviewed and stable  Complications: No apparent anesthesia complications

## 2014-01-22 NOTE — Evaluation (Signed)
Physical Therapy One Time Evaluation Patient Details Name: Richard Wong MRN: [id number] DOB: February 18, 1955 Today's Date: 01/22/2014   History of Present Illness  Pt is a 59 year old male s/p R unicompartmental knee  Clinical Impression  Patient evaluated by Physical Therapy with no further acute PT needs identified. All education has been completed and the patient has no further questions.  Pt seen in short stay and educated on gait, stairs, and exercises with spouse present.  Pt and spouse had no further questions/concerns.  See below for any follow-up Physical Therapy or equipment needs. PT is signing off. Thank you for this referral.     Follow Up Recommendations Home health PT    Equipment Recommendations  None recommended by PT    Recommendations for Other Services       Precautions / Restrictions Precautions Precautions: Knee      Mobility  Bed Mobility Overal bed mobility: Needs Assistance Bed Mobility: Supine to Sit;Sit to Supine     Supine to sit: Supervision Sit to supine: Supervision      Transfers Overall transfer level: Needs assistance Equipment used: Rolling walker (2 wheeled) Transfers: Sit to/from Stand Sit to Stand: Supervision;Min guard         General transfer comment: verbal cues for safe use of RW  Ambulation/Gait Ambulation/Gait assistance: Min guard;Supervision Ambulation Distance (Feet): 180 Feet Assistive device: Rolling walker (2 wheeled) Gait Pattern/deviations: Antalgic;Step-through pattern;Step-to pattern     General Gait Details: verbal cues for technique, posture, RW distance, able to progress to step to pattern  Stairs Stairs: Yes Stairs assistance: Min guard Stair Management: Step to pattern;One rail Right;Forwards Number of Stairs: 2 General stair comments: verbal cues for sequence, safety, technique, performed well however provided backwards with RW handout and verbally discussed in case pt's pain increasing upon return  home, spouse present and educated as well  Wheelchair Mobility    Modified Rankin (Stroke Patients Only)       Balance                                             Pertinent Vitals/Pain Pain Assessment: 0-10 Pain Score: 3  Pain Location: R knee Pain Descriptors / Indicators: Aching;Sore Pain Intervention(s): Limited activity within patient's tolerance;Premedicated before session;Monitored during session    [place] expects to be discharged to:: Private residence Living Arrangements: Spouse/significant other   Type of Home: House Home Access: Stairs to enter Entrance Stairs-Rails: Right Entrance Stairs-Number of Steps: 2 Home Layout: One level Home Equipment: Walker - 2 wheels      Prior Function Level of Independence: Independent               Hand Dominance        Extremity/Trunk Assessment               Lower Extremity Assessment: RLE deficits/detail RLE Deficits / Details: able to perform SLR, active knee flexion approx to 95*       Communication   Communication: No difficulties  Cognition Arousal/Alertness: Awake/alert Behavior During Therapy: WFL for tasks assessed/performed Overall Cognitive Status: Within Functional Limits for tasks assessed                      General Comments      Exercises Total Joint Exercises Ankle Circles/Pumps: AROM;Both;15 reps Quad Sets: AROM;Both;10 reps Towel  Squeeze: AROM;Both;10 reps Short Arc Quad: AROM;Right;10 reps Heel Slides: AROM;Right;5 reps Hip ABduction/ADduction: AROM;Right;10 reps Straight Leg Raises: AROM;Right;10 reps Goniometric ROM: pt with audible and palpable fluid movement in lateral knee with flexion (RN notified and to discuss with MD/PA prior to d/c)      Assessment/Plan    PT Assessment All further PT needs can be met in the next venue of care  PT Diagnosis Difficulty walking;Acute pain   PT Problem List Decreased  strength;Decreased range of motion;Decreased mobility  PT Treatment Interventions     PT Goals (Current goals can be found in the Care Plan section) Acute Rehab PT Goals PT Goal Formulation: All assessment and education complete, DC therapy    Frequency     Barriers to discharge        Co-evaluation               End of Session   Activity Tolerance: Patient tolerated treatment well Patient left: in bed;with call bell/phone within reach;with family/visitor present      Functional Assessment Tool Used: clinical judgement Functional Limitation: Mobility: Walking and moving around Mobility: Walking and Moving Around Current Status (M2500): At least 1 percent but less than 20 percent impaired, limited or restricted Mobility: Walking and Moving Around Goal Status [phone]): At least 1 percent but less than 20 percent impaired, limited or restricted Mobility: Walking and Moving Around Discharge Status [phone]): At least 1 percent but less than 20 percent impaired, limited or restricted    Time: 1451-1516 PT Time Calculation (min) (ACUTE ONLY): 25 min   Charges:   PT Evaluation $Initial PT Evaluation Tier I: 1 Procedure PT Treatments $Gait Training: 8-22 mins $Therapeutic Exercise: 8-22 mins   PT G Codes:   PT G-Codes **NOT FOR INPATIENT CLASS** Functional Assessment Tool Used: clinical judgement Functional Limitation: Mobility: Walking and moving around Mobility: Walking and Moving Around Current Status (X4503): At least 1 percent but less than 20 percent impaired, limited or restricted Mobility: Walking and Moving Around Goal Status [phone]): At least 1 percent but less than 20 percent impaired, limited or restricted Mobility: Walking and Moving Around Discharge Status [phone]): At least 1 percent but less than 20 percent impaired, limited or restricted    Richard Wong,KATHrine E 01/22/2014, 3:43 PM Richard Wong, PT, DPT 01/22/2014 Pager: [phone]

## 2014-01-22 NOTE — Anesthesia Preprocedure Evaluation (Addendum)
Anesthesia Evaluation  Patient identified by MRN, date of birth, ID band Patient awake    Reviewed: Unable to perform ROS - Chart review only  History of Anesthesia Complications Negative for: history of anesthetic complications  Airway Mallampati: II  TM Distance: >3 FB     Dental  (+) Teeth Intact, Dental Advisory Given   Pulmonary neg pulmonary ROS,    Pulmonary exam normal       Cardiovascular hypertension,     Neuro/Psych negative neurological ROS  negative psych ROS   GI/Hepatic Neg liver ROS, GERD-  Medicated and Controlled,  Endo/Other    Renal/GU negative Renal ROS     Musculoskeletal  (+) Arthritis -,   Abdominal   Peds  Hematology   Anesthesia Other Findings   Reproductive/Obstetrics                            Anesthesia Physical Anesthesia Plan  ASA: II  Anesthesia Plan: Spinal   Post-op Pain Management:    Induction:   Airway Management Planned: Simple Face Mask  Additional Equipment:   Intra-op Plan:   Post-operative Plan:   Informed Consent: I have reviewed the patients History and Physical, chart, labs and discussed the procedure including the risks, benefits and alternatives for the proposed anesthesia with the patient or authorized representative who has indicated his/her understanding and acceptance.     Plan Discussed with: CRNA, Anesthesiologist and Surgeon  Anesthesia Plan Comments:         Anesthesia Quick Evaluation

## 2014-01-22 NOTE — Anesthesia Procedure Notes (Addendum)
Spinal  Start time: 01/22/2014 7:26 AM End time: 01/22/2014 7:30 AM Staffing Resident/CRNA: Maxwell Caul Performed by: resident/CRNA  Preanesthetic Checklist Completed: patient identified, site marked, surgical consent, pre-op evaluation, timeout performed, IV checked, risks and benefits discussed and monitors and equipment checked Spinal Block Patient position: sitting Prep: Betadine Patient monitoring: heart rate, continuous pulse ox and blood pressure Approach: midline Location: L3-4 Injection technique: single-shot Needle Needle type: Sprotte  Needle gauge: 24 G Needle length: 9 cm Additional Notes Expiration date of kit checked and confirmed. Patient tolerated procedure well; no complications noted.

## 2014-01-22 NOTE — Interval H&P Note (Signed)
History and Physical Interval Note:  01/22/2014 7:08 AM  Richard Wong  has presented today for surgery, with the diagnosis of RIGHT KNEE OA MEDIALLY   The various methods of treatment have been discussed with the patient and family. After consideration of risks, benefits and other options for treatment, the patient has consented to  Procedure(s): RIGHT UNICOMPARTMENTAL KNEE ARTHROPLASTY MEDIALLY  (Right) as a surgical intervention .  The patient's history has been reviewed, patient examined, no change in status, stable for surgery.  I have reviewed the patient's chart and labs.  Questions were answered to the patient's satisfaction.     Mauri Pole

## 2014-01-23 ENCOUNTER — Encounter (HOSPITAL_COMMUNITY): Payer: Self-pay | Admitting: Orthopedic Surgery

## 2014-03-05 ENCOUNTER — Other Ambulatory Visit: Payer: Self-pay | Admitting: Urology

## 2014-03-05 DIAGNOSIS — R972 Elevated prostate specific antigen [PSA]: Secondary | ICD-10-CM

## 2014-03-15 ENCOUNTER — Ambulatory Visit (HOSPITAL_COMMUNITY): Admission: RE | Admit: 2014-03-15 | Payer: 59 | Source: Ambulatory Visit

## 2014-03-19 ENCOUNTER — Other Ambulatory Visit: Payer: Self-pay | Admitting: Nurse Practitioner

## 2014-04-24 ENCOUNTER — Other Ambulatory Visit: Payer: Self-pay

## 2014-04-24 MED ORDER — LISINOPRIL 10 MG PO TABS: ORAL_TABLET | ORAL | Status: DC

## 2014-08-22 ENCOUNTER — Other Ambulatory Visit: Payer: Self-pay | Admitting: Urology

## 2014-08-24 NOTE — Progress Notes (Signed)
CHEST XRAY 01-17-14 EPIC EKG 01-17-14 EPIC

## 2014-08-24 NOTE — Patient Instructions (Addendum)
Richard Wong  08/24/2014    Your procedure is scheduled on: Monday August 29th, 2016  Report to [facility]  Entrance take [place]  elevators to 3rd floor to  [place] at 945 AM.  Call this number if you have problems the morning of surgery [phone]   Remember: ONLY 1 PERSON MAY GO WITH YOU TO SHORT STAY TO GET  READY MORNING OF [place].  Do not eat food after midnight Saturday night, clear liquids all day Sunday, no clear liuids :After Midnight Sunday night.  FOLLOW ALL BOWEL PREP INSTRUCTIONS FROM DR Alinda Money   Take these medicines the morning of surgery with A SIP OF WATER: ATORVASTATIN , PRILOSEC.                              You may not have any metal on your body including hair pins and              piercings  Do not wear jewelry, make-up, lotions, powders or perfumes, deodorant             Do not wear nail polish.  Do not shave  48 hours prior to surgery.              Men may shave face and neck.   Do not bring valuables to the hospital. [place].  Contacts, dentures or bridgework may not be worn into surgery.  Leave suitcase in the car. After surgery it may be brought to your room.     Patients discharged the day of surgery will not be allowed to drive home.  Name and phone number of your driver:  Special Instructions: N/A              Please read over the following fact sheets you were given: _____________________________________________________________________             [facility] - Preparing for Surgery Before surgery, you can play an important role.  Because skin is not sterile, your skin needs to be as free of germs as possible.  You can reduce the number of germs on your skin by washing with CHG (chlorahexidine gluconate) soap before surgery.  CHG is an antiseptic cleaner which kills germs and bonds with the skin to continue killing germs even after washing. Please DO NOT  use if you have an allergy to CHG or antibacterial soaps.  If your skin becomes reddened/irritated stop using the CHG and inform your nurse when you arrive at Short Stay. Do not shave (including legs and underarms) for at least 48 hours prior to the first CHG shower.  You may shave your face/neck. Please follow these instructions carefully:  1.  Shower with CHG Soap the night before surgery and the  morning of Surgery.  2.  If you choose to wash your hair, wash your hair first as usual with your  normal  shampoo.  3.  After you shampoo, rinse your hair and body thoroughly to remove the  shampoo.                           4.  Use CHG as you would any other liquid soap.  You can  apply chg directly  to the skin and wash                       Gently with a scrungie or clean washcloth.  5.  Apply the CHG Soap to your body ONLY FROM THE NECK DOWN.   Do not use on face/ open                           Wound or open sores. Avoid contact with eyes, ears mouth and genitals (private parts).                       Wash face,  Genitals (private parts) with your normal soap.             6.  Wash thoroughly, paying special attention to the area where your surgery  will be performed.  7.  Thoroughly rinse your body with warm water from the neck down.  8.  DO NOT shower/wash with your normal soap after using and rinsing off  the CHG Soap.                9.  Pat yourself dry with a clean towel.            10.  Wear clean pajamas.            11.  Place clean sheets on your bed the night of your first shower and do not  sleep with pets. Day of Surgery : Do not apply any lotions/deodorants the morning of surgery.  Please wear clean clothes to the hospital/surgery center.  FAILURE TO FOLLOW THESE INSTRUCTIONS MAY RESULT IN THE CANCELLATION OF YOUR SURGERY PATIENT SIGNATURE_________________________________  NURSE  SIGNATURE__________________________________  ________________________________________________________________________   Richard Wong  An incentive spirometer is a tool that can help keep your lungs clear and active. This tool measures how well you are filling your lungs with each breath. Taking long deep breaths may help reverse or decrease the chance of developing breathing (pulmonary) problems (especially infection) following:  A long period of time when you are unable to move or be active. BEFORE THE PROCEDURE   If the spirometer includes an indicator to show your best effort, your nurse or respiratory therapist will set it to a desired goal.  If possible, sit up straight or lean slightly forward. Try not to slouch.  Hold the incentive spirometer in an upright position. INSTRUCTIONS FOR USE   Sit on the edge of your bed if possible, or sit up as far as you can in bed or on a chair.  Hold the incentive spirometer in an upright position.  Breathe out normally.  Place the mouthpiece in your mouth and seal your lips tightly around it.  Breathe in slowly and as deeply as possible, raising the piston or the ball toward the top of the column.  Hold your breath for 3-5 seconds or for as long as possible. Allow the piston or ball to fall to the bottom of the column.  Remove the mouthpiece from your mouth and breathe out normally.  Rest for a few seconds and repeat Steps 1 through 7 at least 10 times every 1-2 hours when you are awake. Take your time and take a few normal breaths between deep breaths.  The spirometer may include an indicator to show your best effort. Use the indicator as a goal to work toward during each  repetition.  After each set of 10 deep breaths, practice coughing to be sure your lungs are clear. If you have an incision (the cut made at the time of surgery), support your incision when coughing by placing a pillow or rolled up towels firmly against it. Once  you are able to get out of bed, walk around indoors and cough well. You may stop using the incentive spirometer when instructed by your caregiver.  RISKS AND COMPLICATIONS  Take your time so you do not get dizzy or light-headed.  If you are in pain, you may need to take or ask for pain medication before doing incentive spirometry. It is harder to take a deep breath if you are having pain. AFTER USE  Rest and breathe slowly and easily.  It can be helpful to keep track of a log of your progress. Your caregiver can provide you with a simple table to help with this. If you are using the spirometer at home, follow these instructions: [place] IF:   You are having difficultly using the spirometer.  You have trouble using the spirometer as often as instructed.  Your pain medication is not giving enough relief while using the spirometer.  You develop fever of 100.5 F (38.1 C) or higher. SEEK IMMEDIATE MEDICAL CARE IF:   You cough up bloody sputum that had not been present before.  You develop fever of 102 F (38.9 C) or greater.  You develop worsening pain at or near the incision site. MAKE SURE YOU:   Understand these instructions.  Will watch your condition.  Will get help right away if you are not doing well or get worse. Document Released: 05/04/2006 Document Revised: 03/16/2011 Document Reviewed: 07/05/2006 ExitCare Patient Information 2014 ExitCare, [place].   ________________________________________________________________________  WHAT IS A BLOOD TRANSFUSION? Blood Transfusion Information  A transfusion is the replacement of blood or some of its parts. Blood is made up of multiple cells which provide different functions.  Red blood cells carry oxygen and are used for blood loss replacement.  White blood cells fight against infection.  Platelets control bleeding.  Plasma helps clot blood.  Other blood products are available for specialized needs, such as  hemophilia or other clotting disorders. BEFORE THE TRANSFUSION  Who gives blood for transfusions?   Healthy volunteers who are fully evaluated to make sure their blood is safe. This is blood bank blood. Transfusion therapy is the safest it has ever been in the practice of medicine. Before blood is taken from a donor, a complete history is taken to make sure that person has no history of diseases nor engages in risky social behavior (examples are intravenous drug use or sexual activity with multiple partners). The donor's travel history is screened to minimize risk of transmitting infections, such as malaria. The donated blood is tested for signs of infectious diseases, such as HIV and hepatitis. The blood is then tested to be sure it is compatible with you in order to minimize the chance of a transfusion reaction. If you or a relative donates blood, this is often done in anticipation of surgery and is not appropriate for emergency situations. It takes many days to process the donated blood. RISKS AND COMPLICATIONS Although transfusion therapy is very safe and saves many lives, the main dangers of transfusion include:   Getting an infectious disease.  Developing a transfusion reaction. This is an allergic reaction to something in the blood you were given. Every precaution is taken to prevent this.  The decision to have a blood transfusion has been considered carefully by your caregiver before blood is given. Blood is not given unless the benefits outweigh the risks. AFTER THE TRANSFUSION  Right after receiving a blood transfusion, you will usually feel much better and more energetic. This is especially true if your red blood cells have gotten low (anemic). The transfusion raises the level of the red blood cells which carry oxygen, and this usually causes an energy increase.  The nurse administering the transfusion will monitor you carefully for complications. HOME CARE INSTRUCTIONS  No special  instructions are needed after a transfusion. You may find your energy is better. Speak with your caregiver about any limitations on activity for underlying diseases you may have. SEEK MEDICAL CARE IF:   Your condition is not improving after your transfusion.  You develop redness or irritation at the intravenous (IV) site. SEEK IMMEDIATE MEDICAL CARE IF:  Any of the following symptoms occur over the next 12 hours:  Shaking chills.  You have a temperature by mouth above 102 F (38.9 C), not controlled by medicine.  Chest, back, or muscle pain.  People around you feel you are not acting correctly or are confused.  Shortness of breath or difficulty breathing.  Dizziness and fainting.  You get a rash or develop hives.  You have a decrease in urine output.  Your urine turns a dark color or changes to pink, red, or brown. Any of the following symptoms occur over the next 10 days:  You have a temperature by mouth above 102 F (38.9 C), not controlled by medicine.  Shortness of breath.  Weakness after normal activity.  The white part of the eye turns yellow (jaundice).  You have a decrease in the amount of urine or are urinating less often.  Your urine turns a dark color or changes to pink, red, or brown. Document Released: 12/20/1999 Document Revised: 03/16/2011 Document Reviewed: 08/08/2007 ExitCare Patient Information 2014 ExitCare, [place].  _______________________________________________________________________   CLEAR LIQUID DIET   Foods Allowed                                                                     Foods Excluded  Coffee and tea, regular and decaf                             liquids that you cannot  Plain Jell-O in any flavor                                             see through such as: Fruit ices (not with fruit pulp)                                     milk, soups, orange juice  Iced Popsicles                                    All solid  food  Carbonated beverages, regular and diet                                    Cranberry, grape and apple juices Sports drinks like Gatorade Lightly seasoned clear broth or consume(fat free) Sugar, honey syrup  Sample Menu Breakfast                                Lunch                                     Supper Cranberry juice                    Beef broth                            Chicken broth Jell-O                                     Grape juice                           Apple juice Coffee or tea                        Jell-O                                      Popsicle                                                Coffee or tea                        Coffee or tea  _____________________________________________________________________

## 2014-08-27 ENCOUNTER — Encounter (HOSPITAL_COMMUNITY)
Admission: RE | Admit: 2014-08-27 | Discharge: 2014-08-27 | Disposition: A | Discharge status: Home / Self Care | Payer: Commercial Managed Care - HMO | Source: Ambulatory Visit | Attending: Urology | Admitting: Urology

## 2014-08-27 ENCOUNTER — Encounter (HOSPITAL_COMMUNITY): Payer: Self-pay

## 2014-08-27 DIAGNOSIS — E780 Pure hypercholesterolemia: Secondary | ICD-10-CM | POA: Diagnosis not present

## 2014-08-27 DIAGNOSIS — C61 Malignant neoplasm of prostate: Secondary | ICD-10-CM | POA: Insufficient documentation

## 2014-08-27 DIAGNOSIS — R262 Difficulty in walking, not elsewhere classified: Secondary | ICD-10-CM | POA: Diagnosis not present

## 2014-08-27 DIAGNOSIS — Z8249 Family history of ischemic heart disease and other diseases of the circulatory system: Secondary | ICD-10-CM | POA: Insufficient documentation

## 2014-08-27 DIAGNOSIS — M2550 Pain in unspecified joint: Secondary | ICD-10-CM | POA: Insufficient documentation

## 2014-08-27 DIAGNOSIS — I10 Essential (primary) hypertension: Secondary | ICD-10-CM | POA: Diagnosis not present

## 2014-08-27 DIAGNOSIS — Z01812 Encounter for preprocedural laboratory examination: Secondary | ICD-10-CM | POA: Diagnosis present

## 2014-08-27 DIAGNOSIS — H9319 Tinnitus, unspecified ear: Secondary | ICD-10-CM | POA: Diagnosis not present

## 2014-08-27 DIAGNOSIS — E785 Hyperlipidemia, unspecified: Secondary | ICD-10-CM | POA: Diagnosis not present

## 2014-08-27 LAB — BASIC METABOLIC PANEL
Anion gap: 4 — ABNORMAL LOW (ref 5–15)
BUN: 14 mg/dL (ref 6–20)
CALCIUM: 9.2 mg/dL (ref 8.9–10.3)
CO2: 28 mmol/L (ref 22–32)
CREATININE: 1.23 mg/dL (ref 0.61–1.24)
Chloride: 109 mmol/L (ref 101–111)
GFR calc non Af Amer: >60 mL/min (ref >60)
GLUCOSE: 96 mg/dL (ref 65–99)
Potassium: 4.4 mmol/L (ref 3.5–5.1)
Sodium: 141 mmol/L (ref 135–145)

## 2014-08-27 LAB — CBC
HCT: 46.3 % (ref 39.0–52.0)
Hemoglobin: 15.2 g/dL (ref 13.0–17.0)
MCH: 29.5 pg (ref 26.0–34.0)
MCHC: 32.8 g/dL (ref 30.0–36.0)
MCV: 89.9 fL (ref 78.0–100.0)
PLATELETS: 218 10*3/uL (ref 150–400)
RBC: 5.15 MIL/uL (ref 4.22–5.81)
RDW: 12.9 % (ref 11.5–15.5)
WBC: 5.7 10*3/uL (ref 4.0–10.5)
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
## 2014-09-03 ENCOUNTER — Encounter (HOSPITAL_COMMUNITY)
Admission: RE | Disposition: A | Discharge status: Home / Self Care | Payer: Self-pay | Source: Ambulatory Visit | Attending: Urology

## 2014-09-03 ENCOUNTER — Encounter (HOSPITAL_COMMUNITY): Payer: Self-pay | Admitting: *Deleted

## 2014-09-03 ENCOUNTER — Inpatient Hospital Stay (HOSPITAL_COMMUNITY)
Admission: RE | Admit: 2014-09-03 | Discharge: 2014-09-04 | DRG: 708 | Disposition: A | Discharge status: Home / Self Care | Payer: Commercial Managed Care - HMO | Source: Ambulatory Visit | Attending: Urology | Admitting: Urology

## 2014-09-03 ENCOUNTER — Inpatient Hospital Stay (HOSPITAL_COMMUNITY): Payer: Commercial Managed Care - HMO | Admitting: Certified Registered Nurse Anesthetist

## 2014-09-03 DIAGNOSIS — Z79899 Other long term (current) drug therapy: Secondary | ICD-10-CM | POA: Diagnosis not present

## 2014-09-03 DIAGNOSIS — I10 Essential (primary) hypertension: Secondary | ICD-10-CM | POA: Diagnosis present

## 2014-09-03 DIAGNOSIS — C61 Malignant neoplasm of prostate: Principal | ICD-10-CM | POA: Diagnosis present

## 2014-09-03 DIAGNOSIS — E780 Pure hypercholesterolemia: Secondary | ICD-10-CM | POA: Diagnosis present

## 2014-09-03 DIAGNOSIS — K219 Gastro-esophageal reflux disease without esophagitis: Secondary | ICD-10-CM | POA: Diagnosis present

## 2014-09-03 DIAGNOSIS — Z7982 Long term (current) use of aspirin: Secondary | ICD-10-CM | POA: Diagnosis not present

## 2014-09-03 DIAGNOSIS — M1990 Unspecified osteoarthritis, unspecified site: Secondary | ICD-10-CM | POA: Diagnosis present

## 2014-09-03 DIAGNOSIS — Z8249 Family history of ischemic heart disease and other diseases of the circulatory system: Secondary | ICD-10-CM | POA: Diagnosis not present

## 2014-09-03 DIAGNOSIS — Z01812 Encounter for preprocedural laboratory examination: Secondary | ICD-10-CM

## 2014-09-03 HISTORY — DX: Malignant neoplasm of prostate: C61

## 2014-09-03 HISTORY — PX: ROBOT ASSISTED LAPAROSCOPIC RADICAL PROSTATECTOMY: SHX5141

## 2014-09-03 HISTORY — PX: LYMPHADENECTOMY: SHX5960

## 2014-09-03 LAB — TYPE AND SCREEN
ABO/RH(D): A NEG
Antibody Screen: NEGATIVE

## 2014-09-03 LAB — HEMOGLOBIN AND HEMATOCRIT, BLOOD
HEMATOCRIT: 41.9 % (ref 39.0–52.0)
Hemoglobin: 14.1 g/dL (ref 13.0–17.0)

## 2014-09-03 SURGERY — ROBOTIC ASSISTED LAPAROSCOPIC RADICAL PROSTATECTOMY LEVEL 3
Anesthesia: General

## 2014-09-03 MED ORDER — HYDROMORPHONE HCL 2 MG/ML IJ SOLN
INTRAMUSCULAR | Status: AC
  Filled 2014-09-03: qty 1

## 2014-09-03 MED ORDER — HYDROMORPHONE HCL 1 MG/ML IJ SOLN
INTRAMUSCULAR | Status: AC
  Filled 2014-09-03: qty 1

## 2014-09-03 MED ORDER — NEOSTIGMINE METHYLSULFATE 10 MG/10ML IV SOLN
INTRAVENOUS | Status: AC
  Filled 2014-09-03: qty 1

## 2014-09-03 MED ORDER — ACETAMINOPHEN 325 MG PO TABS: 650.0000 mg | ORAL_TABLET | ORAL | Status: DC | PRN

## 2014-09-03 MED ORDER — HYDRALAZINE HCL 20 MG/ML IJ SOLN
INTRAMUSCULAR | Status: AC
  Filled 2014-09-03: qty 1

## 2014-09-03 MED ORDER — CEFAZOLIN SODIUM 1-5 GM-% IV SOLN
1.0000 g | Freq: Three times a day (TID) | INTRAVENOUS | Status: AC
  Administered 2014-09-03 – 2014-09-04 (×2): 1 g via INTRAVENOUS
  Filled 2014-09-03 (×2): qty 50

## 2014-09-03 MED ORDER — CEFAZOLIN SODIUM-DEXTROSE 2-3 GM-% IV SOLR
INTRAVENOUS | Status: AC
  Filled 2014-09-03: qty 50

## 2014-09-03 MED ORDER — MENTHOL 3 MG MT LOZG
1.0000 | LOZENGE | OROMUCOSAL | Status: DC | PRN
  Filled 2014-09-03: qty 9

## 2014-09-03 MED ORDER — GLYCOPYRROLATE 0.2 MG/ML IJ SOLN
INTRAMUSCULAR | Status: DC | PRN
  Administered 2014-09-03: 0.6 mg via INTRAVENOUS

## 2014-09-03 MED ORDER — LACTATED RINGERS IV SOLN
INTRAVENOUS | Status: DC | PRN
  Administered 2014-09-03: 13:00:00

## 2014-09-03 MED ORDER — PROMETHAZINE HCL 25 MG/ML IJ SOLN: 6.2500 mg | INTRAMUSCULAR | Status: DC | PRN

## 2014-09-03 MED ORDER — ATORVASTATIN CALCIUM 10 MG PO TABS
20.0000 mg | ORAL_TABLET | Freq: Every day | ORAL | Status: DC
  Administered 2014-09-04: 20 mg via ORAL
  Filled 2014-09-03: qty 2

## 2014-09-03 MED ORDER — SODIUM CHLORIDE 0.9 % IR SOLN
Status: DC | PRN
  Administered 2014-09-03: 1000 mL

## 2014-09-03 MED ORDER — STERILE WATER FOR IRRIGATION IR SOLN
Status: DC | PRN
  Administered 2014-09-03: 1000 mL

## 2014-09-03 MED ORDER — HYDROCODONE-ACETAMINOPHEN 5-325 MG PO TABS: 1.0000 | ORAL_TABLET | Freq: Four times a day (QID) | ORAL | Status: DC | PRN

## 2014-09-03 MED ORDER — DOCUSATE SODIUM 100 MG PO CAPS
100.0000 mg | ORAL_CAPSULE | Freq: Two times a day (BID) | ORAL | Status: DC
  Administered 2014-09-03 – 2014-09-04 (×2): 100 mg via ORAL
  Filled 2014-09-03 (×2): qty 1

## 2014-09-03 MED ORDER — DEXAMETHASONE SODIUM PHOSPHATE 10 MG/ML IJ SOLN
INTRAMUSCULAR | Status: AC
  Filled 2014-09-03: qty 1

## 2014-09-03 MED ORDER — ONDANSETRON HCL 4 MG/2ML IJ SOLN
INTRAMUSCULAR | Status: AC
  Filled 2014-09-03: qty 2

## 2014-09-03 MED ORDER — OMEPRAZOLE MAGNESIUM 20 MG PO TBEC: 10.0000 mg | DELAYED_RELEASE_TABLET | Freq: Every morning | ORAL | Status: DC

## 2014-09-03 MED ORDER — MORPHINE SULFATE (PF) 2 MG/ML IV SOLN
2.0000 mg | INTRAVENOUS | Status: DC | PRN
  Administered 2014-09-03 (×2): 2 mg via INTRAVENOUS
  Filled 2014-09-03 (×2): qty 1

## 2014-09-03 MED ORDER — LACTATED RINGERS IV SOLN
INTRAVENOUS | Status: DC
  Administered 2014-09-03: 14:00:00 via INTRAVENOUS
  Administered 2014-09-03: 1000 mL via INTRAVENOUS
  Administered 2014-09-03: 15:00:00 via INTRAVENOUS

## 2014-09-03 MED ORDER — SULFAMETHOXAZOLE-TRIMETHOPRIM 800-160 MG PO TABS: 1.0000 | ORAL_TABLET | Freq: Two times a day (BID) | ORAL | Status: DC

## 2014-09-03 MED ORDER — DIPHENHYDRAMINE HCL 12.5 MG/5ML PO ELIX: 12.5000 mg | ORAL_SOLUTION | Freq: Four times a day (QID) | ORAL | Status: DC | PRN

## 2014-09-03 MED ORDER — HYDROMORPHONE HCL 1 MG/ML IJ SOLN
0.2500 mg | INTRAMUSCULAR | Status: DC | PRN
  Administered 2014-09-03: 0.5 mg via INTRAVENOUS

## 2014-09-03 MED ORDER — FENTANYL CITRATE (PF) 100 MCG/2ML IJ SOLN
INTRAMUSCULAR | Status: DC | PRN
  Administered 2014-09-03 (×5): 50 ug via INTRAVENOUS

## 2014-09-03 MED ORDER — MIDAZOLAM HCL 5 MG/5ML IJ SOLN
INTRAMUSCULAR | Status: DC | PRN
  Administered 2014-09-03: 2 mg via INTRAVENOUS

## 2014-09-03 MED ORDER — PROPOFOL 10 MG/ML IV BOLUS
INTRAVENOUS | Status: DC | PRN
  Administered 2014-09-03: 170 mg via INTRAVENOUS

## 2014-09-03 MED ORDER — CIPROFLOXACIN IN D5W 400 MG/200ML IV SOLN
INTRAVENOUS | Status: AC
  Filled 2014-09-03: qty 200

## 2014-09-03 MED ORDER — ONDANSETRON HCL 4 MG/2ML IJ SOLN
INTRAMUSCULAR | Status: DC | PRN
  Administered 2014-09-03: 4 mg via INTRAVENOUS

## 2014-09-03 MED ORDER — KETOROLAC TROMETHAMINE 15 MG/ML IJ SOLN
15.0000 mg | Freq: Four times a day (QID) | INTRAMUSCULAR | Status: DC
  Administered 2014-09-03 – 2014-09-04 (×3): 15 mg via INTRAVENOUS
  Filled 2014-09-03 (×3): qty 1

## 2014-09-03 MED ORDER — DIPHENHYDRAMINE HCL 50 MG/ML IJ SOLN: 12.5000 mg | Freq: Four times a day (QID) | INTRAMUSCULAR | Status: DC | PRN

## 2014-09-03 MED ORDER — LIDOCAINE HCL (CARDIAC) 20 MG/ML IV SOLN
INTRAVENOUS | Status: AC
  Filled 2014-09-03: qty 5

## 2014-09-03 MED ORDER — SCOPOLAMINE 1 MG/3DAYS TD PT72
1.0000 | MEDICATED_PATCH | TRANSDERMAL | Status: DC
  Administered 2014-09-03: 1.5 mg via TRANSDERMAL
  Filled 2014-09-03: qty 1

## 2014-09-03 MED ORDER — NEOSTIGMINE METHYLSULFATE 10 MG/10ML IV SOLN
INTRAVENOUS | Status: DC | PRN
  Administered 2014-09-03: 4 mg via INTRAVENOUS

## 2014-09-03 MED ORDER — HEPARIN SODIUM (PORCINE) 1000 UNIT/ML IJ SOLN
INTRAMUSCULAR | Status: AC
  Filled 2014-09-03: qty 1

## 2014-09-03 MED ORDER — CEFAZOLIN SODIUM-DEXTROSE 2-3 GM-% IV SOLR
2.0000 g | INTRAVENOUS | Status: AC
  Administered 2014-09-03: 2 g via INTRAVENOUS

## 2014-09-03 MED ORDER — BUPIVACAINE-EPINEPHRINE 0.25% -1:200000 IJ SOLN
INTRAMUSCULAR | Status: DC | PRN
  Administered 2014-09-03: 30 mL

## 2014-09-03 MED ORDER — KCL IN DEXTROSE-NACL 20-5-0.45 MEQ/L-%-% IV SOLN
INTRAVENOUS | Status: DC
  Administered 2014-09-03 – 2014-09-04 (×3): via INTRAVENOUS
  Filled 2014-09-03 (×4): qty 1000

## 2014-09-03 MED ORDER — BUPIVACAINE-EPINEPHRINE (PF) 0.25% -1:200000 IJ SOLN
INTRAMUSCULAR | Status: AC
  Filled 2014-09-03: qty 30

## 2014-09-03 MED ORDER — HYDROMORPHONE HCL 1 MG/ML IJ SOLN
INTRAMUSCULAR | Status: DC | PRN
  Administered 2014-09-03 (×2): 1 mg via INTRAVENOUS

## 2014-09-03 MED ORDER — PHENOL 1.4 % MT LIQD: 1.0000 | OROMUCOSAL | Status: DC | PRN

## 2014-09-03 MED ORDER — MIDAZOLAM HCL 2 MG/2ML IJ SOLN
INTRAMUSCULAR | Status: AC
  Filled 2014-09-03: qty 4

## 2014-09-03 MED ORDER — PROPOFOL 10 MG/ML IV BOLUS
INTRAVENOUS | Status: AC
  Filled 2014-09-03: qty 20

## 2014-09-03 MED ORDER — METOCLOPRAMIDE HCL 5 MG/ML IJ SOLN
INTRAMUSCULAR | Status: AC
  Filled 2014-09-03: qty 2

## 2014-09-03 MED ORDER — ROCURONIUM BROMIDE 100 MG/10ML IV SOLN
INTRAVENOUS | Status: AC
  Filled 2014-09-03: qty 1

## 2014-09-03 MED ORDER — FENTANYL CITRATE (PF) 250 MCG/5ML IJ SOLN
INTRAMUSCULAR | Status: AC
  Filled 2014-09-03: qty 25

## 2014-09-03 MED ORDER — KCL IN DEXTROSE-NACL 20-5-0.45 MEQ/L-%-% IV SOLN
INTRAVENOUS | Status: AC
  Filled 2014-09-03: qty 1000

## 2014-09-03 MED ORDER — SODIUM CHLORIDE 0.9 % IV BOLUS (SEPSIS)
1000.0000 mL | Freq: Once | INTRAVENOUS | Status: AC
  Administered 2014-09-03: 1000 mL via INTRAVENOUS

## 2014-09-03 MED ORDER — HYDRALAZINE HCL 20 MG/ML IJ SOLN
INTRAMUSCULAR | Status: DC | PRN
  Administered 2014-09-03 (×2): 2 mg via INTRAVENOUS

## 2014-09-03 MED ORDER — GLYCOPYRROLATE 0.2 MG/ML IJ SOLN
INTRAMUSCULAR | Status: AC
  Filled 2014-09-03: qty 3

## 2014-09-03 MED ORDER — ROCURONIUM BROMIDE 100 MG/10ML IV SOLN
INTRAVENOUS | Status: DC | PRN
  Administered 2014-09-03: 10 mg via INTRAVENOUS
  Administered 2014-09-03: 50 mg via INTRAVENOUS

## 2014-09-03 MED ORDER — PANTOPRAZOLE SODIUM 20 MG PO TBEC
20.0000 mg | DELAYED_RELEASE_TABLET | Freq: Every day | ORAL | Status: DC
  Administered 2014-09-04: 20 mg via ORAL
  Filled 2014-09-03: qty 1

## 2014-09-03 MED ORDER — LIDOCAINE HCL (CARDIAC) 20 MG/ML IV SOLN
INTRAVENOUS | Status: DC | PRN
  Administered 2014-09-03: 50 mg via INTRAVENOUS

## 2014-09-03 MED ORDER — CIPROFLOXACIN IN D5W 400 MG/200ML IV SOLN
400.0000 mg | INTRAVENOUS | Status: AC
  Administered 2014-09-03: 400 mg via INTRAVENOUS

## 2014-09-03 MED ORDER — SCOPOLAMINE 1 MG/3DAYS TD PT72
MEDICATED_PATCH | TRANSDERMAL | Status: AC
  Filled 2014-09-03: qty 1

## 2014-09-03 SURGICAL SUPPLY — 52 items
CABLE HIGH FREQUENCY MONO STRZ (ELECTRODE) ×4 IMPLANT
CATH FOLEY 2WAY SLVR 18FR 30CC (CATHETERS) ×4 IMPLANT
CATH ROBINSON RED A/P 16FR (CATHETERS) ×4 IMPLANT
CATH ROBINSON RED A/P 8FR (CATHETERS) ×4 IMPLANT
CATH TIEMANN FOLEY 18FR 5CC (CATHETERS) ×4 IMPLANT
CHLORAPREP W/TINT 26ML (MISCELLANEOUS) ×4 IMPLANT
CLIP LIGATING HEM O LOK PURPLE (MISCELLANEOUS) ×16 IMPLANT
CLOTH BEACON ORANGE TIMEOUT ST (SAFETY) ×4 IMPLANT
COVER SURGICAL LIGHT HANDLE (MISCELLANEOUS) ×4 IMPLANT
COVER TIP SHEARS 8 DVNC (MISCELLANEOUS) ×2 IMPLANT
COVER TIP SHEARS 8MM DA VINCI (MISCELLANEOUS) ×2
CUTTER ECHEON FLEX ENDO 45 340 (ENDOMECHANICALS) ×4 IMPLANT
DECANTER SPIKE VIAL GLASS SM (MISCELLANEOUS) ×4 IMPLANT
DRAPE SURG IRRIG POUCH 19X23 (DRAPES) ×4 IMPLANT
DRSG TEGADERM 4X4.75 (GAUZE/BANDAGES/DRESSINGS) ×4 IMPLANT
DRSG TEGADERM 6X8 (GAUZE/BANDAGES/DRESSINGS) ×8 IMPLANT
ELECT REM PT RETURN 9FT ADLT (ELECTROSURGICAL) ×4
ELECTRODE REM PT RTRN 9FT ADLT (ELECTROSURGICAL) ×2 IMPLANT
GAUZE SPONGE 2X2 8PLY STRL LF (GAUZE/BANDAGES/DRESSINGS) ×2 IMPLANT
GLOVE BIO SURGEON STRL SZ 6.5 (GLOVE) ×3 IMPLANT
GLOVE BIO SURGEONS STRL SZ 6.5 (GLOVE) ×1
GLOVE BIOGEL M STRL SZ7.5 (GLOVE) ×8 IMPLANT
GOWN STRL REUS W/TWL LRG LVL3 (GOWN DISPOSABLE) ×12 IMPLANT
HOLDER FOLEY CATH W/STRAP (MISCELLANEOUS) ×4 IMPLANT
IV LACTATED RINGERS 1000ML (IV SOLUTION) ×4 IMPLANT
KIT ACCESSORY DA VINCI DISP (KITS) ×2
KIT ACCESSORY DVNC DISP (KITS) ×2 IMPLANT
LIQUID BAND (GAUZE/BANDAGES/DRESSINGS) ×4 IMPLANT
NDL SAFETY ECLIPSE 18X1.5 (NEEDLE) ×2 IMPLANT
NEEDLE HYPO 18GX1.5 SHARP (NEEDLE) ×2
PACK ROBOT UROLOGY CUSTOM (CUSTOM PROCEDURE TRAY) ×4 IMPLANT
RELOAD GREEN ECHELON 45 (STAPLE) ×4 IMPLANT
SCISSORS LAP 5X35 DISP (ENDOMECHANICALS) ×4 IMPLANT
SET TUBE IRRIG SUCTION NO TIP (IRRIGATION / IRRIGATOR) ×4 IMPLANT
SHEET LAVH (DRAPES) ×4 IMPLANT
SOLUTION ELECTROLUBE (MISCELLANEOUS) ×4 IMPLANT
SPONGE GAUZE 2X2 STER 10/PKG (GAUZE/BANDAGES/DRESSINGS) ×2
SUT ETHILON 3 0 PS 1 (SUTURE) ×4 IMPLANT
SUT MNCRL 3 0 RB1 (SUTURE) ×2 IMPLANT
SUT MNCRL 3 0 VIOLET RB1 (SUTURE) ×4 IMPLANT
SUT MNCRL AB 4-0 PS2 18 (SUTURE) ×8 IMPLANT
SUT MONOCRYL 3 0 RB1 (SUTURE) ×6
SUT VIC AB 0 CT1 27 (SUTURE) ×2
SUT VIC AB 0 CT1 27XBRD ANTBC (SUTURE) ×2 IMPLANT
SUT VIC AB 0 UR5 27 (SUTURE) ×4 IMPLANT
SUT VIC AB 2-0 SH 27 (SUTURE) ×2
SUT VIC AB 2-0 SH 27X BRD (SUTURE) ×2 IMPLANT
SUT VICRYL 0 UR6 27IN ABS (SUTURE) ×8 IMPLANT
SYR 27GX1/2 1ML LL SAFETY (SYRINGE) ×4 IMPLANT
TOWEL OR 17X26 10 PK STRL BLUE (TOWEL DISPOSABLE) ×4 IMPLANT
TOWEL OR NON WOVEN STRL DISP B (DISPOSABLE) ×4 IMPLANT
WATER STERILE IRR 1500ML POUR (IV SOLUTION) ×4 IMPLANT

## 2014-09-03 NOTE — Interval H&P Note (Signed)
History and Physical Interval Note:  09/03/2014 10:29 AM  Richard Wong  has presented today for surgery, with the diagnosis of PROSTATE CANCER  The various methods of treatment have been discussed with the patient and family. After consideration of risks, benefits and other options for treatment, the patient has consented to  Procedure(s): ROBOTIC ASSISTED LAPAROSCOPIC RADICAL PROSTATECTOMY, POSSIBLE OPEN LEVEL 3 (N/A) PELVIC LYMPHADENECTOMY (Bilateral) as a surgical intervention .  The patient's history has been reviewed, patient examined, no change in status, stable for surgery.  I have reviewed the patient's chart and labs.  Questions were answered to the patient's satisfaction.     Naeem Quillin,LES

## 2014-09-03 NOTE — Anesthesia Preprocedure Evaluation (Addendum)
Anesthesia Evaluation  Patient identified by MRN, date of birth, ID band Patient awake    Reviewed: Allergy & Precautions, NPO status , Patient's Chart, lab work & pertinent test results  History of Anesthesia Complications Negative for: history of anesthetic complications  Airway Mallampati: II  TM Distance: >3 FB Neck ROM: Full    Dental  (+) Teeth Intact, Dental Advisory Given   Pulmonary neg pulmonary ROS,    Pulmonary exam normal       Cardiovascular hypertension, Pt. on medications Normal cardiovascular exam    Neuro/Psych negative neurological ROS     GI/Hepatic Neg liver ROS, GERD-  Medicated,  Endo/Other  negative endocrine ROS  Renal/GU negative Renal ROS     Musculoskeletal   Abdominal   Peds  Hematology   Anesthesia Other Findings   Reproductive/Obstetrics                            Anesthesia Physical Anesthesia Plan  ASA: II  Anesthesia Plan: General   Post-op Pain Management:    Induction: Intravenous  Airway Management Planned: Oral ETT  Additional Equipment:   Intra-op Plan:   Post-operative Plan: Extubation in OR  Informed Consent: I have reviewed the patients History and Physical, chart, labs and discussed the procedure including the risks, benefits and alternatives for the proposed anesthesia with the patient or authorized representative who has indicated his/her understanding and acceptance.   Dental advisory given  Plan Discussed with: CRNA and Anesthesiologist  Anesthesia Plan Comments:        Anesthesia Quick Evaluation

## 2014-09-03 NOTE — Progress Notes (Signed)
Hgb. 14.1-Hct. 41.9-results noted

## 2014-09-03 NOTE — Discharge Instructions (Signed)

## 2014-09-03 NOTE — Anesthesia Procedure Notes (Signed)
Procedure Name: Intubation Date/Time: 09/03/2014 11:39 AM Performed by: Deliah Boston Pre-anesthesia Checklist: Patient identified, Emergency Drugs available, Suction available and Patient being monitored Patient Re-evaluated:Patient Re-evaluated prior to inductionOxygen Delivery Method: Circle System Utilized Preoxygenation: Pre-oxygenation with 100% oxygen Intubation Type: IV induction Ventilation: Mask ventilation without difficulty Laryngoscope Size: Mac and 4 Grade View: Grade I Tube type: Oral Tube size: 8.0 mm Number of attempts: 1 Airway Equipment and Method: Stylet and Oral airway Placement Confirmation: ETT inserted through vocal cords under direct vision,  positive ETCO2 and breath sounds checked- equal and bilateral Secured at: 23 cm Tube secured with: Tape Dental Injury: Teeth and Oropharynx as per pre-operative assessment

## 2014-09-03 NOTE — Progress Notes (Signed)
Hgb. And Hct. Drawn by lab. 

## 2014-09-03 NOTE — Op Note (Addendum)
Preoperative diagnosis: Clinically localized adenocarcinoma of the prostate (clinical stage T1c Nx Mx)  Postoperative diagnosis: Clinically localized adenocarcinoma of the prostate (clinical stage T1c Nx Mx)  Procedure:  1. Robotic assisted laparoscopic radical prostatectomy (bilateral nerve sparing) 2. Bilateral robotic assisted laparoscopic pelvic lymphadenectomy  Surgeon: Pryor Curia. M.D.  Assistant: Debbrah Alar, PA-C  Resident: Dr. Jearld Adjutant  Anesthesia: General  Complications: None  EBL: 400 mL  IVF:  2300 mL crystalloid  Specimens: 1. Prostate and seminal vesicles 2. Right pelvic lymph nodes 3. Left pelvic lymph nodes  Disposition of specimens: Pathology  Drains: 1. 20 Fr coude catheter 2. # 19 Blake pelvic drain  Indication: Richard Wong is a 59 y.o. year old patient with clinically localized prostate cancer.  After a thorough review of the management options for treatment of prostate cancer, he elected to proceed with surgical therapy and the above procedure(s).  We have discussed the potential benefits and risks of the procedure, side effects of the proposed treatment, the likelihood of the patient achieving the goals of the procedure, and any potential problems that might occur during the procedure or recuperation. Informed consent has been obtained.  Description of procedure:  The patient was taken to the operating room and a general anesthetic was administered. He was given preoperative antibiotics, placed in the dorsal lithotomy position, and prepped and draped in the usual sterile fashion. Next a preoperative timeout was performed. A urethral catheter was placed into the bladder and a site was selected near the umbilicus for placement of the camera port. Due to his prior midline incision, a site was chosen to the left of the umbilicus lateral to his prior incision. This was placed using a standard open Hassan technique which allowed entry into the  peritoneal cavity under direct vision and without difficulty. A 12 mm port was placed and a pneumoperitoneum established. The camera was then used to inspect the abdomen and there was no evidence of any intra-abdominal injuries.  He did have extensive adhesions although they were located in the midline and superior to the operative field.  It was felt that all ports could be placed in their usual optimal position without the need for adhesiolysis. The remaining abdominal ports were then placed. 8 mm robotic ports were placed in the right lower quadrant, left lower quadrant, and far left lateral abdominal wall. A 5 mm port was placed in the right upper quadrant and a 12 mm port was placed in the right lateral abdominal wall for laparoscopic assistance. All ports were placed under direct vision without difficulty. The surgical cart was then docked.   Utilizing the cautery scissors, the bladder was reflected posteriorly allowing entry into the space of Retzius and identification of the endopelvic fascia and prostate. The periprostatic fat was then removed from the prostate allowing full exposure of the endopelvic fascia. The endopelvic fascia was then incised from the apex back to the base of the prostate bilaterally and the underlying levator muscle fibers were swept laterally off the prostate thereby isolating the dorsal venous complex. The dorsal vein was then stapled and divided with a 45 mm Flex Echelon stapler. Attention then turned to the bladder neck which was divided anteriorly thereby allowing entry into the bladder and exposure of the urethral catheter. The catheter balloon was deflated and the catheter was brought into the operative field and used to retract the prostate anteriorly. The posterior bladder neck was then examined and was divided allowing further dissection between the  bladder and prostate posteriorly until the vasa deferentia and seminal vessels were identified. The vasa deferentia were  isolated, divided, and lifted anteriorly. The seminal vesicles were dissected down to their tips with care to control the seminal vascular arterial blood supply. These structures were then lifted anteriorly and the space between Denonvillier's fascia and the anterior rectum was developed with a combination of sharp and blunt dissection. This isolated the vascular pedicles of the prostate.  The lateral prostatic fascia was then sharply incised allowing release of the neurovascular bundles bilaterally. The vascular pedicles of the prostate were then ligated with Weck clips between the prostate and neurovascular bundles and divided with sharp cold scissor dissection resulting in neurovascular bundle preservation. The neurovascular bundles were then separated off the apex of the prostate and urethra bilaterally.  The urethra was then sharply transected allowing the prostate specimen to be disarticulated. The pelvis was copiously irrigated and hemostasis was ensured. There was no evidence for rectal injury.  Attention then turned to the right pelvic sidewall. The fibrofatty tissue between the external iliac vein, confluence of the iliac vessels, hypogastric artery, and Cooper's ligament was dissected free from the pelvic sidewall with care to preserve the obturator nerve. Weck clips were used for lymphostasis and hemostasis. An identical procedure was performed on the contralateral side and the lymphatic packets were removed for permanent pathologic analysis.  Attention then turned to the urethral anastomosis. A 2-0 Vicryl slip knot was placed between Denonvillier's fascia, the posterior bladder neck, and the posterior urethra to reapproximate these structures. A double-armed 3-0 Monocryl suture was then used to perform a 360 running tension-free anastomosis between the bladder neck and urethra. A new urethral catheter was then placed into the bladder and irrigated. There were no blood clots within the bladder  and the anastomosis appeared to be watertight. A #19 Blake drain was then brought through the left lateral 8 mm port site and positioned appropriately within the pelvis. It was secured to the skin with a nylon suture. The surgical cart was then undocked. The right lateral 12 mm port site was closed at the fascial level with a 0 Vicryl suture placed laparoscopically. All remaining ports were then removed under direct vision. The prostate specimen was removed intact within the Endopouch retrieval bag via the periumbilical camera port site. This fascial opening was closed with two running 0 Vicryl sutures. 0.25% Marcaine was then injected into all port sites and all incisions were reapproximated at the skin level with 4-0 Monocryl subcuticular sutures and Dermabond. The patient appeared to tolerate the procedure well and without complications. The patient was able to be extubated and transferred to the recovery unit in satisfactory condition.   Pryor Curia MD

## 2014-09-03 NOTE — Progress Notes (Signed)
Patient ID: Richard Wong, male   DOB: [birth year], 59 y.o.   MRN: [id number]  Post-op note  Subjective: The patient is doing well.  No complaints.  Objective: Vital signs in last 24 hours: Temp:  [97.5 F (36.4 C)-98 F (36.7 C)] 97.5 F (36.4 C) (08/29 1657) Pulse Rate:  [72-97] 97 (08/29 1657) Resp:  [8-18] 12 (08/29 1657) BP: (129-173)/(64-88) 137/75 mmHg (08/29 1657) SpO2:  [97 %-100 %] 97 % (08/29 1657) Weight:  [83.825 kg (184 lb 12.8 oz)] 83.825 kg (184 lb 12.8 oz) (08/29 0930)  Intake/Output from previous day:   Intake/Output this shift: Total I/O In: 3600 [I.V.:2600; IV Piggyback:1000] Out: 690 [Urine:225; Drains:65; Blood:400]  Physical Exam:  General: Alert and oriented. Abdomen: Soft, Nondistended. Incisions: Clean and dry. GU: Urine clear  Lab Results:  Recent Labs  09/03/14 1539  HGB 14.1  HCT 41.9    Assessment/Plan: POD#0   1) Continue to monitor, ambulate, IS   Pryor Curia. MD   LOS: 0 days   Janneth Krasner,LES 09/03/2014, 5:23 PM

## 2014-09-03 NOTE — Transfer of Care (Signed)
Immediate Anesthesia Transfer of Care Note  Patient: Richard Wong  Procedure(s) Performed: Procedure(s): ROBOTIC ASSISTED LAPAROSCOPIC RADICAL PROSTATECTOMY,  LEVEL 3 (N/A) PELVIC LYMPHADENECTOMY (Bilateral)  Patient Location: PACU  Anesthesia Type:General  Level of Consciousness:  sedated, patient cooperative and responds to stimulation  Airway & Oxygen Therapy:Patient Spontanous Breathing and Patient connected to face mask oxgen  Post-op Assessment:  Report given to PACU RN and Post -op Vital signs reviewed and stable  Post vital signs:  Reviewed and stable  Last Vitals:  Filed Vitals:   09/03/14 0932  BP: 141/84  Pulse: 72  Temp: 36.6 C  Resp: 18    Complications: No apparent anesthesia complications

## 2014-09-04 ENCOUNTER — Encounter (HOSPITAL_COMMUNITY): Payer: Self-pay | Admitting: Urology

## 2014-09-04 LAB — HEMOGLOBIN AND HEMATOCRIT, BLOOD
HEMATOCRIT: 39 % (ref 39.0–52.0)
Hemoglobin: 12.4 g/dL — ABNORMAL LOW (ref 13.0–17.0)

## 2014-09-04 MED ORDER — HYDROCODONE-ACETAMINOPHEN 5-325 MG PO TABS: 1.0000 | ORAL_TABLET | Freq: Four times a day (QID) | ORAL | Status: DC | PRN

## 2014-09-04 MED ORDER — BISACODYL 10 MG RE SUPP
10.0000 mg | Freq: Once | RECTAL | Status: AC
  Administered 2014-09-04: 10 mg via RECTAL
  Filled 2014-09-04: qty 1

## 2014-09-04 NOTE — Discharge Summary (Signed)
  Date of admission: 09/03/2014  Date of discharge: 09/04/2014  Admission diagnosis: Prostate Cancer  Discharge diagnosis: Prostate Cancer  History and Physical: For full details, please see admission history and physical. Briefly, Richard Wong is a 59 y.o. gentleman with localized prostate cancer.  After discussing management/treatment options, he elected to proceed with surgical treatment.  Hospital Course: Richard Wong was taken to the operating room on 09/03/2014 and underwent a robotic assisted laparoscopic radical prostatectomy. He tolerated this procedure well and without complications. Postoperatively, he was able to be transferred to a regular hospital room following recovery from anesthesia.  He was able to begin ambulating the night of surgery. He remained hemodynamically stable overnight.  He had excellent urine output with appropriately minimal output from his pelvic drain and his pelvic drain was removed on POD #1.  He was transitioned to oral pain medication, tolerated a clear liquid diet, and had met all discharge criteria and was able to be discharged home later on POD#1.  Laboratory values:  Recent Labs  09/03/14 1539 09/04/14 0600  HGB 14.1 12.4*  HCT 41.9 39.0    Disposition: Home  Discharge instruction: He was instructed to be ambulatory but to refrain from heavy lifting, strenuous activity, or driving. He was instructed on urethral catheter care.  Discharge medications:     Medication List    STOP taking these medications        aspirin EC 325 MG tablet     aspirin EC 81 MG tablet     celecoxib 200 MG capsule  Commonly known as:  CELEBREX     cephALEXin 500 MG capsule  Commonly known as:  KEFLEX     FISH OIL PO     HM VITAMIN D3 4000 UNITS Caps  Generic drug:  Cholecalciferol     HYDROcodone-acetaminophen 7.5-325 MG per tablet  Commonly known as:  NORCO  Replaced by:  HYDROcodone-acetaminophen 5-325 MG per tablet     methocarbamol 500 MG  tablet  Commonly known as:  ROBAXIN     OSTEO COMPLEX PO      TAKE these medications        atorvastatin 20 MG tablet  Commonly known as:  LIPITOR  Take 1 tablet (20 mg total) by mouth daily. Or as directed     HYDROcodone-acetaminophen 5-325 MG per tablet  Commonly known as:  NORCO  Take 1-2 tablets by mouth every 6 (six) hours as needed.     hydrocortisone cream 1 %  Apply 1 application topically daily as needed for itching.     lisinopril 10 MG tablet  Commonly known as:  PRINIVIL,ZESTRIL  Take 1 tablet by mouth  daily     PRILOSEC OTC 20 MG tablet  Generic drug:  omeprazole  Take 10 mg by mouth every morning.     sulfamethoxazole-trimethoprim 800-160 MG per tablet  Commonly known as:  BACTRIM DS,SEPTRA DS  Take 1 tablet by mouth 2 (two) times daily. Start the day prior to foley removal appointment        Followup: He will followup in 1 week for catheter removal and to discuss his surgical pathology results.

## 2014-09-04 NOTE — Progress Notes (Signed)
Patient ID: Richard Wong, male   DOB: 10/21/1955, 59 y.o.   MRN: [id number]  1 Day Post-Op Subjective: The patient is doing well.  No nausea or vomiting. Pain is adequately controlled.  Objective: Vital signs in last 24 hours: Temp:  [97.5 F (36.4 C)-98 F (36.7 C)] 97.7 F (36.5 C) (08/30 0612) Pulse Rate:  [72-100] 77 (08/30 0612) Resp:  [8-24] 21 (08/30 0612) BP: (112-173)/(56-88) 112/56 mmHg (08/30 0612) SpO2:  [97 %-100 %] 98 % (08/30 0612) FiO2 (%):  [2 %] 2 % (08/30 0612) Weight:  [83.825 kg (184 lb 12.8 oz)] 83.825 kg (184 lb 12.8 oz) (08/29 0930)  Intake/Output from previous day: 08/29 0701 - 08/30 0700 In: 6537.5 [P.O.:760; I.V.:4727.5; IV Piggyback:1050] Out: 3460 [Urine:2925; Drains:135; Blood:400] Intake/Output this shift:    Physical Exam:  General: Alert and oriented. CV: RRR Lungs: Clear bilaterally. GI: Soft, Nondistended. Incisions: Clean, dry, and intact Urine: Clear Extremities: Nontender, no erythema, no edema.  Lab Results:  Recent Labs  09/03/14 1539 09/04/14 0600  HGB 14.1 12.4*  HCT 41.9 39.0      Assessment/Plan: POD# 1 s/p robotic prostatectomy.  1) SL IVF 2) Ambulate, Incentive spirometry 3) Transition to oral pain medication 4) Dulcolax suppository 5) D/C pelvic drain 6) Plan for likely discharge later today   Pryor Curia. MD   LOS: 1 day   Kalyna Paolella,LES 09/04/2014, 7:07 AM

## 2014-09-04 NOTE — Anesthesia Postprocedure Evaluation (Signed)
Anesthesia Post Note  Patient: Richard Wong  Procedure(s) Performed: Procedure(s) (LRB): ROBOTIC ASSISTED LAPAROSCOPIC RADICAL PROSTATECTOMY,  LEVEL 3 (N/A) PELVIC LYMPHADENECTOMY (Bilateral)  Anesthesia type: general  Patient location: PACU  Post pain: Pain level controlled  Post assessment: Patient's Cardiovascular Status Stable  Last Vitals:  Filed Vitals:   09/04/14 0612  BP: 112/56  Pulse: 77  Temp: 36.5 C  Resp: 21    Post vital signs: Reviewed and stable  Level of consciousness: sedated  Complications: No apparent anesthesia complications

## 2014-12-05 ENCOUNTER — Encounter: Payer: Self-pay | Admitting: Nurse Practitioner

## 2014-12-05 ENCOUNTER — Ambulatory Visit (INDEPENDENT_AMBULATORY_CARE_PROVIDER_SITE_OTHER): Payer: Commercial Managed Care - HMO | Admitting: Nurse Practitioner

## 2014-12-05 VITALS — BP 140/88 | HR 85 | Ht 69.0 in | Wt 185.0 lb

## 2014-12-05 DIAGNOSIS — E785 Hyperlipidemia, unspecified: Secondary | ICD-10-CM | POA: Diagnosis not present

## 2014-12-05 LAB — BASIC METABOLIC PANEL
BUN: 11 mg/dL (ref 7–25)
CO2: 27 mmol/L (ref 20–31)
Calcium: 8.9 mg/dL (ref 8.6–10.3)
Chloride: 106 mmol/L (ref 98–110)
Creat: 1.11 mg/dL (ref 0.70–1.33)
Glucose, Bld: 83 mg/dL (ref 65–99)
Potassium: 3.8 mmol/L (ref 3.5–5.3)
Sodium: 142 mmol/L (ref 135–146)

## 2014-12-05 LAB — LIPID PANEL
Cholesterol: 133 mg/dL (ref 125–200)
HDL: 50 mg/dL (ref >=40)
LDL Cholesterol: 50 mg/dL (ref <130)
Total CHOL/HDL Ratio: 2.7 Ratio (ref <=5.0)
Triglycerides: 165 mg/dL — ABNORMAL HIGH (ref <150)
VLDL: 33 mg/dL — ABNORMAL HIGH (ref <30)

## 2014-12-05 LAB — HEPATIC FUNCTION PANEL
ALT: 17 U/L (ref 9–46)
AST: 20 U/L (ref 10–35)
Albumin: 4 g/dL (ref 3.6–5.1)
Alkaline Phosphatase: 44 U/L (ref 40–115)
Bilirubin, Direct: 0.1 mg/dL (ref <=0.2)
Indirect Bilirubin: 0.5 mg/dL (ref 0.2–1.2)
Total Bilirubin: 0.6 mg/dL (ref 0.2–1.2)
Total Protein: 6.7 g/dL (ref 6.1–8.1)

## 2014-12-05 MED ORDER — LISINOPRIL 10 MG PO TABS: 10.0000 mg | ORAL_TABLET | Freq: Every day | ORAL | Status: DC

## 2014-12-05 MED ORDER — ATORVASTATIN CALCIUM 20 MG PO TABS: 10.0000 mg | ORAL_TABLET | Freq: Every day | ORAL | Status: DC
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
## 2014-12-05 NOTE — Patient Instructions (Addendum)
We will be checking the following labs today - BMET, lipids and HPF   Medication Instructions:    Continue with your current medicines.   I refilled your Lipitor and Lisinopril    Testing/Procedures To Be Arranged:  N/A  Follow-Up:   See me in one year    Other Special Instructions:   Monitor your blood pressure - goal is less than 140/90 most of the time - if you're not at this goal - call me.     If you need a refill on your cardiac medications before your next appointment, please call your pharmacy.   Call the [place] office at [phone] if you have any questions, problems or concerns.

## 2014-12-06 ENCOUNTER — Telehealth: Payer: Self-pay | Admitting: Nurse Practitioner

## 2014-12-06 NOTE — Telephone Encounter (Signed)
F/u  Pt returning Rn phone call. Please call back and discuss.   

## 2014-12-06 NOTE — Telephone Encounter (Signed)
Called patient.  Advised of labs. Patient expressed understanding.

## 2015-10-31 ENCOUNTER — Encounter: Payer: Self-pay | Admitting: *Deleted

## 2015-12-11 ENCOUNTER — Ambulatory Visit: Payer: Commercial Managed Care - HMO | Admitting: Nurse Practitioner

## 2015-12-12 ENCOUNTER — Encounter: Payer: Self-pay | Admitting: Nurse Practitioner

## 2015-12-16 ENCOUNTER — Ambulatory Visit: Payer: Commercial Managed Care - HMO | Admitting: Nurse Practitioner

## 2015-12-17 ENCOUNTER — Encounter: Payer: Self-pay | Admitting: Nurse Practitioner

## 2015-12-17 ENCOUNTER — Ambulatory Visit (INDEPENDENT_AMBULATORY_CARE_PROVIDER_SITE_OTHER): Payer: Commercial Managed Care - HMO | Admitting: Nurse Practitioner

## 2015-12-17 VITALS — BP 160/100 | HR 71 | Ht 69.0 in | Wt 194.4 lb

## 2015-12-17 DIAGNOSIS — E7800 Pure hypercholesterolemia, unspecified: Secondary | ICD-10-CM

## 2015-12-17 DIAGNOSIS — I10 Essential (primary) hypertension: Secondary | ICD-10-CM

## 2015-12-17 MED ORDER — LISINOPRIL 10 MG PO TABS: 10.0000 mg | ORAL_TABLET | Freq: Every day | ORAL | 3 refills | Status: DC

## 2015-12-17 MED ORDER — ATORVASTATIN CALCIUM 20 MG PO TABS: 10.0000 mg | ORAL_TABLET | Freq: Every day | ORAL | 3 refills | Status: DC
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
## 2015-12-17 NOTE — Patient Instructions (Addendum)
We will be checking the following labs today - NONE   Medication Instructions:    Continue with your current medicines.  I refilled your Lipitor and Lisinopril today to your mail order    Testing/Procedures To Be Arranged:  N/A  Follow-Up:   See me in 6 months.   Other Special Instructions:   Think about what we talked about today  Keep a check on your BP for me    If you need a refill on your cardiac medications before your next appointment, please call your pharmacy.   Call the [place] office at [phone] if you have any questions, problems or concerns.

## 2016-04-06 ENCOUNTER — Ambulatory Visit (INDEPENDENT_AMBULATORY_CARE_PROVIDER_SITE_OTHER): Payer: Commercial Managed Care - HMO | Admitting: Internal Medicine

## 2016-04-06 ENCOUNTER — Encounter: Payer: Self-pay | Admitting: Internal Medicine

## 2016-04-06 VITALS — BP 126/78 | HR 84 | Temp 98.4°F | Resp 16 | Ht 69.0 in | Wt 192.0 lb

## 2016-04-06 DIAGNOSIS — I10 Essential (primary) hypertension: Secondary | ICD-10-CM

## 2016-04-06 DIAGNOSIS — Z130 Encounter for screening for diseases of the blood and blood-forming organs and certain disorders involving the immune mechanism: Secondary | ICD-10-CM

## 2016-04-06 DIAGNOSIS — Z0000 Encounter for general adult medical examination without abnormal findings: Secondary | ICD-10-CM | POA: Diagnosis not present

## 2016-04-06 DIAGNOSIS — Z23 Encounter for immunization: Secondary | ICD-10-CM | POA: Diagnosis not present

## 2016-04-06 DIAGNOSIS — Z131 Encounter for screening for diabetes mellitus: Secondary | ICD-10-CM

## 2016-04-06 DIAGNOSIS — Z96651 Presence of right artificial knee joint: Secondary | ICD-10-CM

## 2016-04-06 DIAGNOSIS — E7800 Pure hypercholesterolemia, unspecified: Secondary | ICD-10-CM

## 2016-04-06 DIAGNOSIS — C61 Malignant neoplasm of prostate: Secondary | ICD-10-CM

## 2016-04-06 DIAGNOSIS — E559 Vitamin D deficiency, unspecified: Secondary | ICD-10-CM

## 2016-04-06 DIAGNOSIS — Z79899 Other long term (current) drug therapy: Secondary | ICD-10-CM

## 2016-04-06 DIAGNOSIS — E782 Mixed hyperlipidemia: Secondary | ICD-10-CM

## 2016-04-06 DIAGNOSIS — E349 Endocrine disorder, unspecified: Secondary | ICD-10-CM

## 2016-04-06 DIAGNOSIS — Z0001 Encounter for general adult medical examination with abnormal findings: Secondary | ICD-10-CM

## 2016-04-06 LAB — CBC WITH DIFFERENTIAL/PLATELET
BASOS ABS: 63 {cells}/uL (ref 0–200)
BASOS PCT: 1 %
EOS PCT: 4 %
Eosinophils Absolute: 252 cells/uL (ref 15–500)
HCT: 46.3 % (ref 38.5–50.0)
HEMOGLOBIN: 15.6 g/dL (ref 13.2–17.1)
LYMPHS ABS: 2457 {cells}/uL (ref 850–3900)
Lymphocytes Relative: 39 %
MCH: 30.2 pg (ref 27.0–33.0)
MCHC: 33.7 g/dL (ref 32.0–36.0)
MCV: 89.6 fL (ref 80.0–100.0)
MPV: 10.2 fL (ref 7.5–12.5)
Monocytes Absolute: 504 cells/uL (ref 200–950)
Monocytes Relative: 8 %
NEUTROS ABS: 3024 {cells}/uL (ref 1500–7800)
Neutrophils Relative %: 48 %
PLATELETS: 215 10*3/uL (ref 140–400)
RBC: 5.17 MIL/uL (ref 4.20–5.80)
RDW: 13.7 % (ref 11.0–15.0)
WBC: 6.3 10*3/uL (ref 3.8–10.8)

## 2016-04-06 LAB — BASIC METABOLIC PANEL WITH GFR
BUN: 17 mg/dL (ref 7–25)
CHLORIDE: 107 mmol/L (ref 98–110)
CO2: 26 mmol/L (ref 20–31)
Calcium: 9.3 mg/dL (ref 8.6–10.3)
Creat: 1.42 mg/dL — ABNORMAL HIGH (ref 0.70–1.25)
GFR, EST NON AFRICAN AMERICAN: 53 mL/min — AB (ref >=60)
GFR, Est African American: 62 mL/min (ref >=60)
Glucose, Bld: 97 mg/dL (ref 65–99)
POTASSIUM: 4.1 mmol/L (ref 3.5–5.3)
SODIUM: 143 mmol/L (ref 135–146)

## 2016-04-06 LAB — LIPID PANEL
CHOL/HDL RATIO: 2.7 ratio (ref <5.0)
Cholesterol: 130 mg/dL (ref <200)
HDL: 48 mg/dL (ref >40)
LDL Cholesterol: 60 mg/dL (ref <100)
Triglycerides: 110 mg/dL (ref <150)
VLDL: 22 mg/dL (ref <30)

## 2016-04-06 LAB — IRON AND TIBC
%SAT: 32 % (ref 15–60)
IRON: 95 ug/dL (ref 50–180)
TIBC: 294 ug/dL (ref 250–425)
UIBC: 199 ug/dL (ref 125–400)

## 2016-04-06 LAB — HEPATIC FUNCTION PANEL
ALK PHOS: 44 U/L (ref 40–115)
ALT: 23 U/L (ref 9–46)
AST: 22 U/L (ref 10–35)
Albumin: 4.3 g/dL (ref 3.6–5.1)
BILIRUBIN DIRECT: 0.1 mg/dL (ref <=0.2)
BILIRUBIN INDIRECT: 0.5 mg/dL (ref 0.2–1.2)
Total Bilirubin: 0.6 mg/dL (ref 0.2–1.2)
Total Protein: 6.8 g/dL (ref 6.1–8.1)
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
## 2016-04-07 LAB — MICROALBUMIN / CREATININE URINE RATIO
Creatinine, Urine: 202 mg/dL (ref 20–370)
MICROALB/CREAT RATIO: 1 ug/mg{creat} (ref <30)
Microalb, Ur: 0.3 mg/dL

## 2016-04-07 LAB — URINALYSIS, ROUTINE W REFLEX MICROSCOPIC
BILIRUBIN URINE: NEGATIVE
GLUCOSE, UA: NEGATIVE
HGB URINE DIPSTICK: NEGATIVE
Ketones, ur: NEGATIVE
LEUKOCYTES UA: NEGATIVE
Nitrite: NEGATIVE
PH: 5.5 (ref 5.0–8.0)
PROTEIN: NEGATIVE
Specific Gravity, Urine: 1.025 (ref 1.001–1.035)

## 2016-04-07 LAB — PSA: PSA: 0.1 ng/mL (ref <=4.0)

## 2016-04-07 LAB — HEMOGLOBIN A1C
HEMOGLOBIN A1C: 5.4 % (ref <5.7)
MEAN PLASMA GLUCOSE: 108 mg/dL

## 2016-04-07 LAB — MAGNESIUM: Magnesium: 2 mg/dL (ref 1.5–2.5)

## 2016-04-07 LAB — TESTOSTERONE: TESTOSTERONE: 392 ng/dL (ref 250–827)

## 2016-04-07 LAB — TSH: TSH: 1.26 mIU/L (ref 0.40–4.50)

## 2016-04-07 LAB — INSULIN, RANDOM: Insulin: 46.7 u[IU]/mL — ABNORMAL HIGH (ref 2.0–19.6)

## 2016-04-07 LAB — VITAMIN B12: VITAMIN B 12: 358 pg/mL (ref 200–1100)

## 2016-04-07 LAB — VITAMIN D 25 HYDROXY (VIT D DEFICIENCY, FRACTURES): Vit D, 25-Hydroxy: 51 ng/mL (ref 30–100)

## 2016-04-10 ENCOUNTER — Encounter: Payer: Self-pay | Admitting: Internal Medicine

## 2016-05-20 ENCOUNTER — Encounter: Payer: Self-pay | Admitting: *Deleted

## 2016-06-03 ENCOUNTER — Encounter: Payer: Self-pay | Admitting: *Deleted

## 2016-06-22 ENCOUNTER — Encounter: Payer: Self-pay | Admitting: Nurse Practitioner

## 2016-06-22 ENCOUNTER — Ambulatory Visit (INDEPENDENT_AMBULATORY_CARE_PROVIDER_SITE_OTHER): Payer: Commercial Managed Care - HMO | Admitting: Nurse Practitioner

## 2016-06-22 VITALS — BP 150/92 | HR 80 | Ht 69.0 in | Wt 184.1 lb

## 2016-06-22 DIAGNOSIS — I10 Essential (primary) hypertension: Secondary | ICD-10-CM | POA: Diagnosis not present

## 2016-06-22 DIAGNOSIS — E7800 Pure hypercholesterolemia, unspecified: Secondary | ICD-10-CM | POA: Diagnosis not present
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
## 2016-06-22 NOTE — Patient Instructions (Addendum)
We will be checking the following labs today - NONE   Medication Instructions:    Continue with your current medicines.     Testing/Procedures To Be Arranged:  N/A  Follow-Up:   See me in one year    Other Special Instructions:   [facility] if you decide about changing primary care.   Keep up the good work!!    If you need a refill on your cardiac medications before your next appointment, please call your pharmacy.   Call the [place] office at [phone] if you have any questions, problems or concerns.

## 2016-10-31 ENCOUNTER — Other Ambulatory Visit: Payer: Self-pay | Admitting: Nurse Practitioner

## 2016-11-17 ENCOUNTER — Other Ambulatory Visit: Payer: Self-pay | Admitting: Internal Medicine

## 2016-12-17 ENCOUNTER — Ambulatory Visit
Admission: RE | Admit: 2016-12-17 | Discharge: 2016-12-17 | Disposition: A | Discharge status: Home / Self Care | Payer: Commercial Managed Care - HMO | Source: Ambulatory Visit | Attending: Occupational Medicine | Admitting: Occupational Medicine

## 2016-12-17 ENCOUNTER — Other Ambulatory Visit: Payer: Self-pay | Admitting: Occupational Medicine

## 2016-12-17 DIAGNOSIS — Z0289 Encounter for other administrative examinations: Secondary | ICD-10-CM

## 2017-04-06 ENCOUNTER — Encounter: Payer: Self-pay | Admitting: Internal Medicine

## 2017-05-10 ENCOUNTER — Other Ambulatory Visit: Payer: Self-pay | Admitting: Nurse Practitioner

## 2017-05-10 MED ORDER — ATORVASTATIN CALCIUM 20 MG PO TABS: 10.0000 mg | ORAL_TABLET | Freq: Every day | ORAL | 0 refills | Status: DC

## 2017-05-10 NOTE — Telephone Encounter (Signed)
Pt's medication was sent to pt's pharmacy as requested. Confirmation received.  °

## 2017-06-15 ENCOUNTER — Ambulatory Visit: Payer: No Typology Code available for payment source | Admitting: Nurse Practitioner

## 2017-08-02 ENCOUNTER — Other Ambulatory Visit: Payer: Self-pay | Admitting: Nurse Practitioner

## 2017-09-05 ENCOUNTER — Other Ambulatory Visit: Payer: Self-pay | Admitting: Nurse Practitioner

## 2017-09-27 ENCOUNTER — Other Ambulatory Visit: Payer: Self-pay | Admitting: Nurse Practitioner

## 2017-11-01 ENCOUNTER — Other Ambulatory Visit: Payer: Self-pay | Admitting: Nurse Practitioner

## 2018-01-25 ENCOUNTER — Other Ambulatory Visit: Payer: Self-pay | Admitting: Nurse Practitioner

## 2018-10-07 ENCOUNTER — Other Ambulatory Visit: Payer: Self-pay | Admitting: Nurse Practitioner

## 2018-11-30 ENCOUNTER — Other Ambulatory Visit: Payer: Self-pay | Admitting: Nurse Practitioner

## 2018-12-19 ENCOUNTER — Telehealth: Payer: Self-pay

## 2018-12-19 MED ORDER — LISINOPRIL 10 MG PO TABS: 10.0000 mg | ORAL_TABLET | Freq: Every day | ORAL | 0 refills | Status: DC

## 2018-12-19 NOTE — Telephone Encounter (Signed)
Refill sent for Lisinopril.

## 2019-06-02 ENCOUNTER — Other Ambulatory Visit: Payer: Self-pay | Admitting: Nurse Practitioner

## 2019-06-20 IMAGING — CR DG CHEST 2V
2 series · 2 of 2 positions shown · non-contrast
Comparison: 11/18/2014

CLINICAL DATA: Physical exam

EXAM:
CHEST  2 VIEW

[w chest pa]
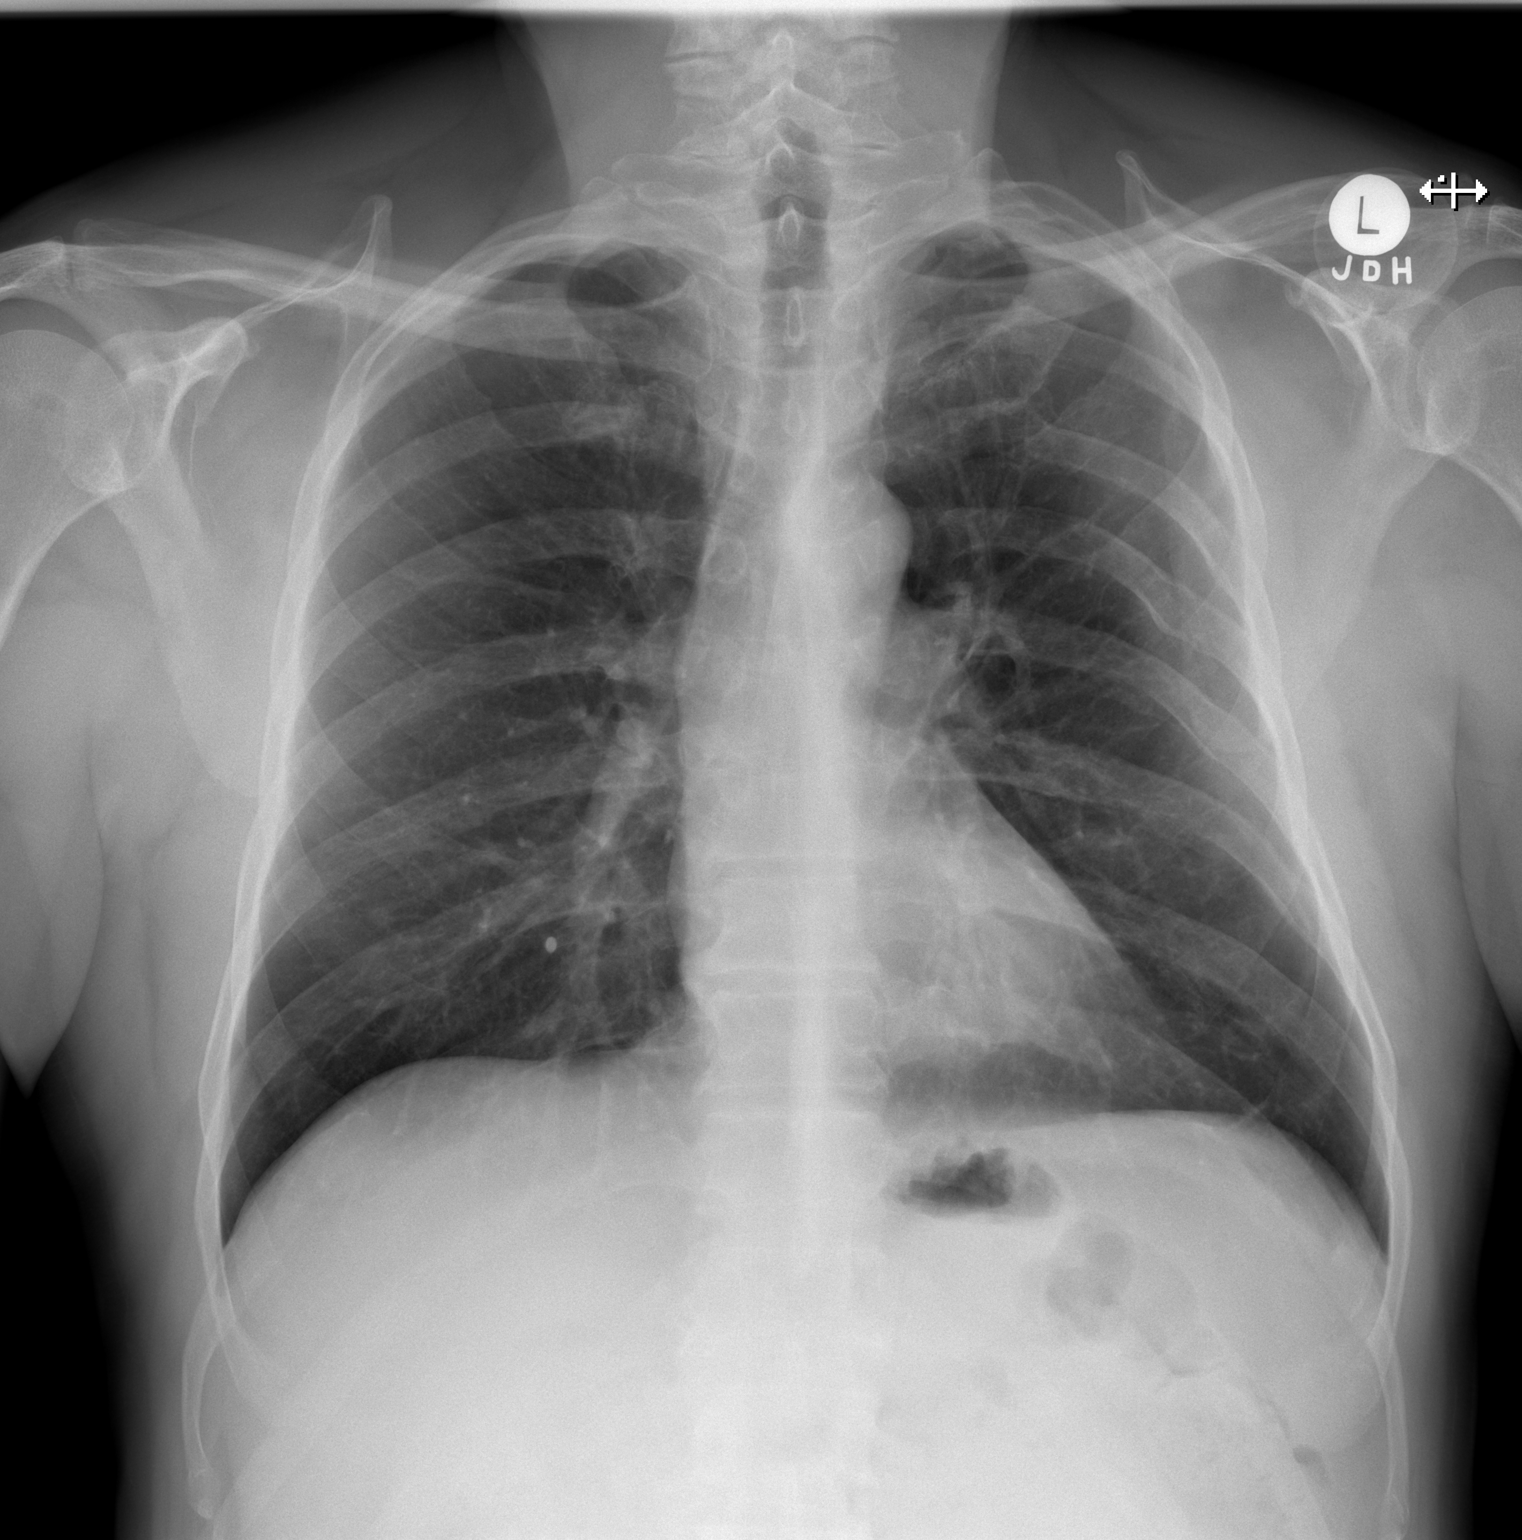

[w chest lat]
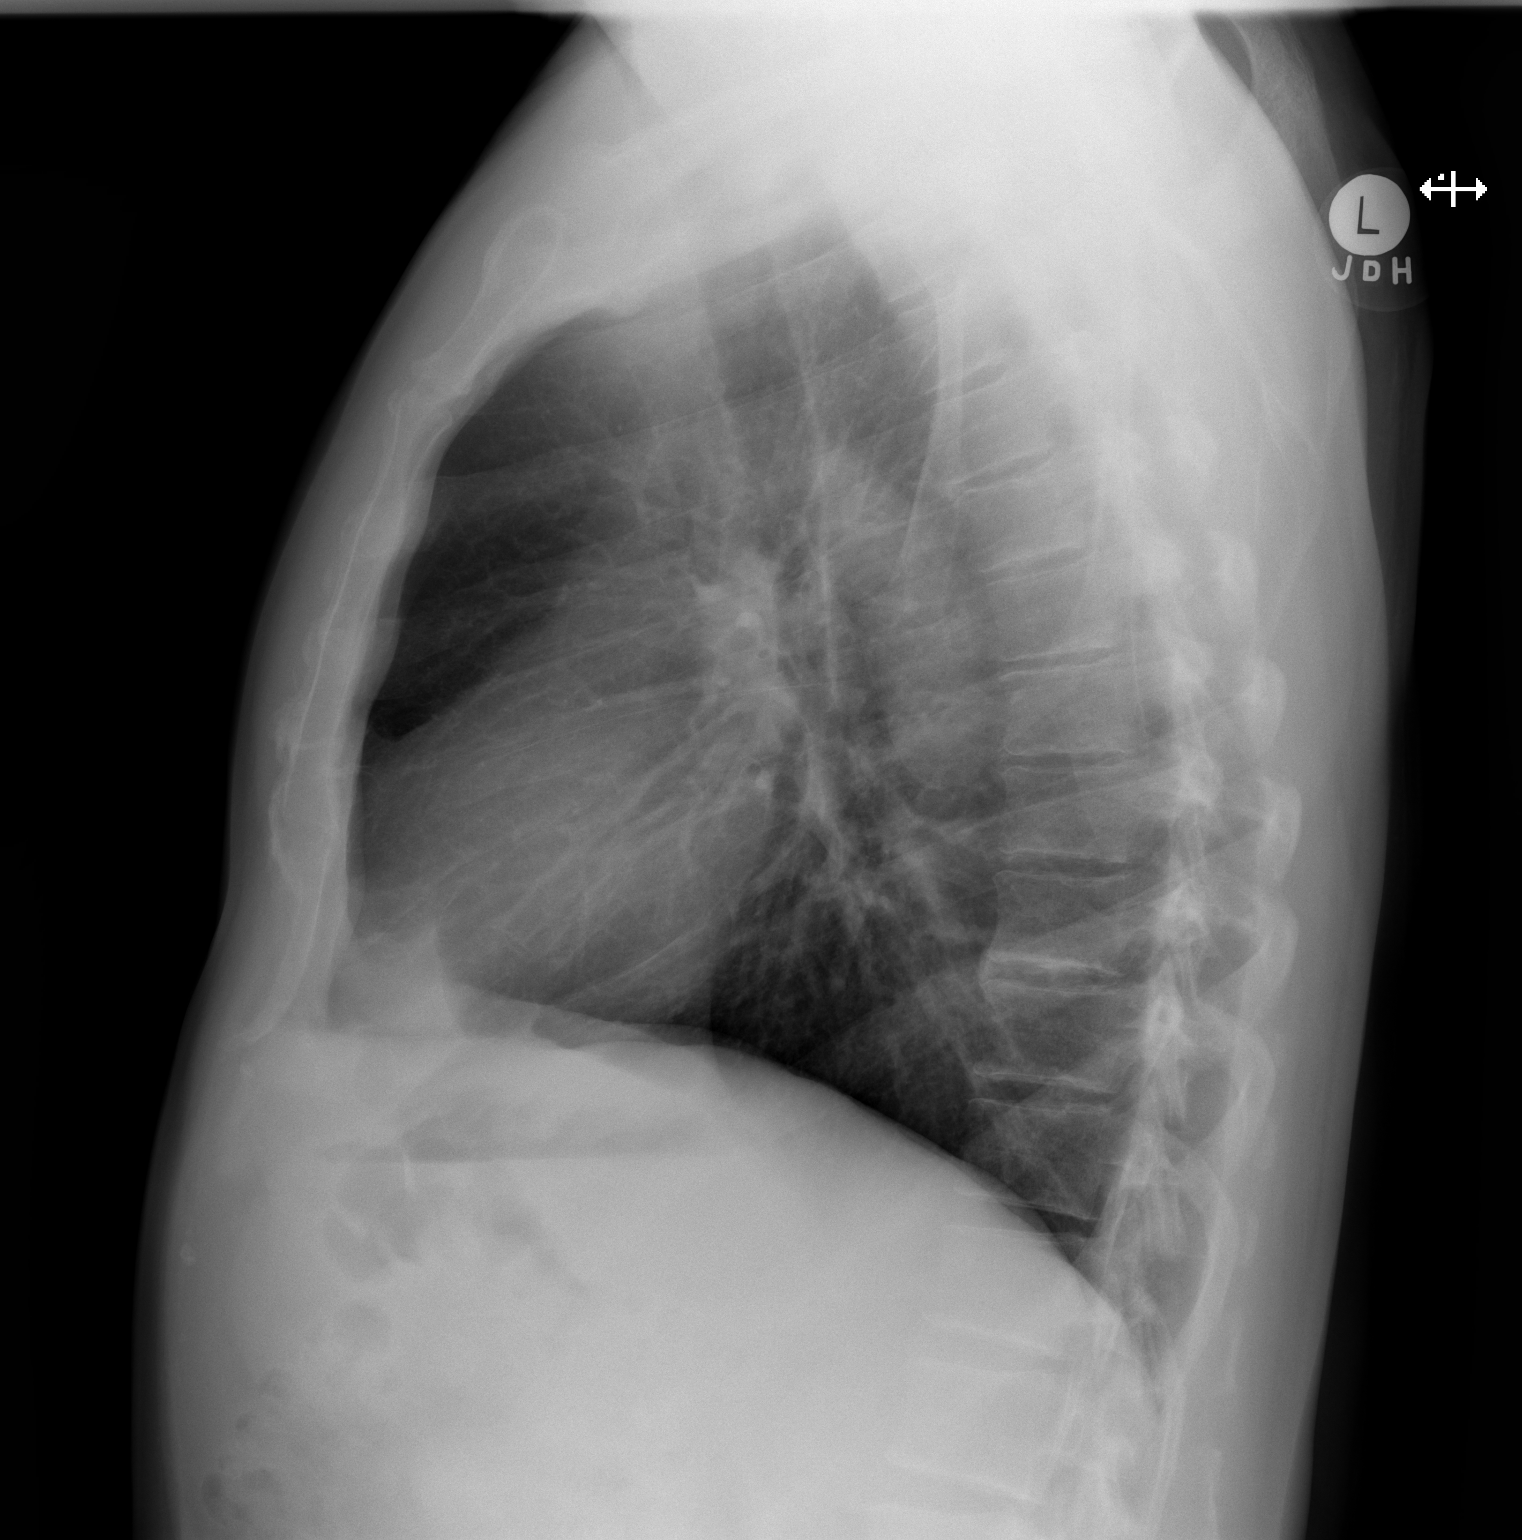

[2 of 2 positions shown; findings below may reference images not displayed]

FINDINGS: Heart and mediastinal contours are within normal limits. No focal
opacities or effusions. No acute bony abnormality. Multiple old
healed left rib fractures, unchanged.
IMPRESSION: No active cardiopulmonary disease.
